# Patient Record
Sex: Female | Born: 1970 | Race: White | Hispanic: No | Marital: Single | Smoking: Former smoker
Health system: Southern US, Community
[De-identification: ages and names within clinical notes are randomized; demographics above are authoritative.]

## PROBLEM LIST (undated history)

## (undated) DIAGNOSIS — R0600 Dyspnea, unspecified: Secondary | ICD-10-CM

## (undated) DIAGNOSIS — K219 Gastro-esophageal reflux disease without esophagitis: Secondary | ICD-10-CM

## (undated) DIAGNOSIS — F32A Depression, unspecified: Secondary | ICD-10-CM

## (undated) DIAGNOSIS — R011 Cardiac murmur, unspecified: Secondary | ICD-10-CM

## (undated) DIAGNOSIS — R569 Unspecified convulsions: Secondary | ICD-10-CM

## (undated) DIAGNOSIS — N3946 Mixed incontinence: Secondary | ICD-10-CM

## (undated) DIAGNOSIS — T8859XA Other complications of anesthesia, initial encounter: Secondary | ICD-10-CM

## (undated) DIAGNOSIS — E559 Vitamin D deficiency, unspecified: Secondary | ICD-10-CM

## (undated) DIAGNOSIS — L680 Hirsutism: Secondary | ICD-10-CM

## (undated) DIAGNOSIS — Z8669 Personal history of other diseases of the nervous system and sense organs: Secondary | ICD-10-CM

## (undated) DIAGNOSIS — Q210 Ventricular septal defect: Secondary | ICD-10-CM

## (undated) DIAGNOSIS — E781 Pure hyperglyceridemia: Secondary | ICD-10-CM

## (undated) DIAGNOSIS — F419 Anxiety disorder, unspecified: Secondary | ICD-10-CM

## (undated) DIAGNOSIS — R5382 Chronic fatigue, unspecified: Secondary | ICD-10-CM

## (undated) DIAGNOSIS — F329 Major depressive disorder, single episode, unspecified: Secondary | ICD-10-CM

## (undated) HISTORY — DX: Major depressive disorder, single episode, unspecified: F32.9

## (undated) HISTORY — DX: Cardiac murmur, unspecified: R01.1

## (undated) HISTORY — DX: Depression, unspecified: F32.A

## (undated) HISTORY — PX: CHOLECYSTECTOMY: SHX55

## (undated) HISTORY — DX: Anxiety disorder, unspecified: F41.9

## (undated) HISTORY — PX: SPINE SURGERY: SHX786

## (undated) HISTORY — PX: CARDIAC VALVE REPLACEMENT: SHX585

---
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
## 1999-12-04 ENCOUNTER — Other Ambulatory Visit: Admission: RE | Admit: 1999-12-04 | Discharge: 1999-12-04 | Payer: Self-pay | Admitting: Obstetrics and Gynecology

## 2001-01-01 ENCOUNTER — Other Ambulatory Visit: Admission: RE | Admit: 2001-01-01 | Discharge: 2001-01-01 | Payer: Self-pay | Admitting: Obstetrics and Gynecology

## 2002-12-04 ENCOUNTER — Ambulatory Visit (HOSPITAL_COMMUNITY): Admission: RE | Admit: 2002-12-04 | Discharge: 2002-12-04 | Payer: Self-pay | Admitting: *Deleted

## 2002-12-04 ENCOUNTER — Encounter: Payer: Self-pay | Admitting: *Deleted

## 2003-01-12 ENCOUNTER — Encounter: Admission: RE | Admit: 2003-01-12 | Discharge: 2003-01-12 | Payer: Self-pay | Admitting: *Deleted

## 2003-01-19 ENCOUNTER — Ambulatory Visit (HOSPITAL_COMMUNITY): Admission: RE | Admit: 2003-01-19 | Discharge: 2003-01-19 | Payer: Self-pay | Admitting: *Deleted

## 2003-01-21 ENCOUNTER — Encounter: Admission: RE | Admit: 2003-01-21 | Discharge: 2003-01-21 | Payer: Self-pay | Admitting: Family Medicine

## 2003-02-04 ENCOUNTER — Encounter: Admission: RE | Admit: 2003-02-04 | Discharge: 2003-02-04 | Payer: Self-pay | Admitting: Family Medicine

## 2003-02-08 ENCOUNTER — Inpatient Hospital Stay (HOSPITAL_COMMUNITY): Admission: AD | Admit: 2003-02-08 | Discharge: 2003-02-08 | Payer: Self-pay | Admitting: *Deleted

## 2003-02-14 ENCOUNTER — Inpatient Hospital Stay (HOSPITAL_COMMUNITY): Admission: AD | Admit: 2003-02-14 | Discharge: 2003-02-15 | Payer: Self-pay | Admitting: *Deleted

## 2003-05-21 ENCOUNTER — Emergency Department (HOSPITAL_COMMUNITY): Admission: EM | Admit: 2003-05-21 | Discharge: 2003-05-21 | Payer: Self-pay | Admitting: Emergency Medicine

## 2003-10-26 ENCOUNTER — Encounter: Admission: RE | Admit: 2003-10-26 | Discharge: 2003-10-26 | Payer: Self-pay | Admitting: Family Medicine

## 2003-11-24 ENCOUNTER — Other Ambulatory Visit: Admission: RE | Admit: 2003-11-24 | Discharge: 2003-11-24 | Payer: Self-pay | Admitting: Family Medicine

## 2003-11-24 ENCOUNTER — Encounter: Admission: RE | Admit: 2003-11-24 | Discharge: 2003-11-24 | Payer: Self-pay | Admitting: Family Medicine

## 2004-01-31 ENCOUNTER — Encounter: Admission: RE | Admit: 2004-01-31 | Discharge: 2004-01-31 | Payer: Self-pay | Admitting: Family Medicine

## 2004-10-13 ENCOUNTER — Ambulatory Visit: Payer: Self-pay | Admitting: Sports Medicine

## 2004-12-11 ENCOUNTER — Ambulatory Visit: Payer: Self-pay | Admitting: Sports Medicine

## 2004-12-20 ENCOUNTER — Other Ambulatory Visit: Admission: RE | Admit: 2004-12-20 | Discharge: 2004-12-20 | Payer: Self-pay | Admitting: Family Medicine

## 2004-12-20 ENCOUNTER — Ambulatory Visit: Payer: Self-pay | Admitting: Family Medicine

## 2004-12-28 ENCOUNTER — Encounter (INDEPENDENT_AMBULATORY_CARE_PROVIDER_SITE_OTHER): Payer: Self-pay | Admitting: *Deleted

## 2004-12-28 LAB — CONVERTED CEMR LAB

## 2005-04-03 ENCOUNTER — Ambulatory Visit: Payer: Self-pay | Admitting: Family Medicine

## 2005-06-06 ENCOUNTER — Ambulatory Visit: Payer: Self-pay | Admitting: Family Medicine

## 2005-07-05 ENCOUNTER — Ambulatory Visit: Payer: Self-pay | Admitting: Family Medicine

## 2005-07-24 ENCOUNTER — Ambulatory Visit: Payer: Self-pay | Admitting: Family Medicine

## 2005-10-16 ENCOUNTER — Ambulatory Visit: Payer: Self-pay | Admitting: Sports Medicine

## 2005-10-17 ENCOUNTER — Ambulatory Visit: Payer: Self-pay | Admitting: Family Medicine

## 2006-07-22 ENCOUNTER — Ambulatory Visit: Payer: Self-pay | Admitting: Sports Medicine

## 2006-08-08 ENCOUNTER — Ambulatory Visit: Payer: Self-pay | Admitting: Family Medicine

## 2006-08-08 ENCOUNTER — Encounter (INDEPENDENT_AMBULATORY_CARE_PROVIDER_SITE_OTHER): Payer: Self-pay | Admitting: Specialist

## 2006-09-18 ENCOUNTER — Ambulatory Visit: Payer: Self-pay | Admitting: Family Medicine

## 2006-10-23 ENCOUNTER — Ambulatory Visit: Payer: Self-pay | Admitting: Family Medicine

## 2006-11-21 DIAGNOSIS — F339 Major depressive disorder, recurrent, unspecified: Secondary | ICD-10-CM | POA: Insufficient documentation

## 2006-11-21 DIAGNOSIS — L680 Hirsutism: Secondary | ICD-10-CM

## 2006-11-22 ENCOUNTER — Encounter (INDEPENDENT_AMBULATORY_CARE_PROVIDER_SITE_OTHER): Payer: Self-pay | Admitting: *Deleted

## 2006-11-27 ENCOUNTER — Ambulatory Visit: Payer: Self-pay | Admitting: Family Medicine

## 2006-11-27 ENCOUNTER — Encounter (INDEPENDENT_AMBULATORY_CARE_PROVIDER_SITE_OTHER): Payer: Self-pay | Admitting: Family Medicine

## 2006-11-27 LAB — CONVERTED CEMR LAB: TSH: 0.963 microintl units/mL (ref 0.350–5.50)

## 2006-11-28 ENCOUNTER — Encounter (INDEPENDENT_AMBULATORY_CARE_PROVIDER_SITE_OTHER): Payer: Self-pay | Admitting: *Deleted

## 2007-05-09 ENCOUNTER — Ambulatory Visit: Payer: Self-pay | Admitting: Family Medicine

## 2007-10-02 ENCOUNTER — Ambulatory Visit: Payer: Self-pay | Admitting: Family Medicine

## 2007-10-31 ENCOUNTER — Telehealth (INDEPENDENT_AMBULATORY_CARE_PROVIDER_SITE_OTHER): Payer: Self-pay | Admitting: Family Medicine

## 2007-10-31 ENCOUNTER — Ambulatory Visit: Payer: Self-pay | Admitting: Family Medicine

## 2007-10-31 LAB — CONVERTED CEMR LAB
Glucose, Urine, Semiquant: NEGATIVE
Nitrite: NEGATIVE
Protein, U semiquant: NEGATIVE
Specific Gravity, Urine: 1.015

## 2007-11-01 ENCOUNTER — Encounter (INDEPENDENT_AMBULATORY_CARE_PROVIDER_SITE_OTHER): Payer: Self-pay | Admitting: Family Medicine

## 2007-11-07 ENCOUNTER — Encounter (INDEPENDENT_AMBULATORY_CARE_PROVIDER_SITE_OTHER): Payer: Self-pay | Admitting: Family Medicine

## 2007-11-21 ENCOUNTER — Ambulatory Visit: Payer: Self-pay | Admitting: Family Medicine

## 2007-12-19 ENCOUNTER — Ambulatory Visit: Payer: Self-pay | Admitting: Family Medicine

## 2007-12-19 DIAGNOSIS — F411 Generalized anxiety disorder: Secondary | ICD-10-CM | POA: Insufficient documentation

## 2008-01-05 ENCOUNTER — Encounter: Payer: Self-pay | Admitting: *Deleted

## 2008-01-26 ENCOUNTER — Encounter: Admission: RE | Admit: 2008-01-26 | Discharge: 2008-01-26 | Payer: Self-pay | Admitting: Family Medicine

## 2008-01-26 ENCOUNTER — Ambulatory Visit: Payer: Self-pay | Admitting: Sports Medicine

## 2008-01-26 ENCOUNTER — Telehealth: Payer: Self-pay | Admitting: *Deleted

## 2008-02-04 ENCOUNTER — Ambulatory Visit: Payer: Self-pay | Admitting: Family Medicine

## 2008-02-06 ENCOUNTER — Encounter: Admission: RE | Admit: 2008-02-06 | Discharge: 2008-02-06 | Payer: Self-pay | Admitting: Family Medicine

## 2008-02-18 ENCOUNTER — Encounter: Payer: Self-pay | Admitting: *Deleted

## 2008-04-13 ENCOUNTER — Telehealth: Payer: Self-pay | Admitting: *Deleted

## 2008-04-14 ENCOUNTER — Ambulatory Visit: Payer: Self-pay | Admitting: Family Medicine

## 2008-05-26 ENCOUNTER — Ambulatory Visit: Payer: Self-pay | Admitting: Family Medicine

## 2008-06-17 ENCOUNTER — Telehealth: Payer: Self-pay | Admitting: *Deleted

## 2008-06-21 ENCOUNTER — Ambulatory Visit: Payer: Self-pay | Admitting: Family Medicine

## 2008-07-24 ENCOUNTER — Emergency Department (HOSPITAL_COMMUNITY): Admission: EM | Admit: 2008-07-24 | Discharge: 2008-07-24 | Payer: Self-pay | Admitting: Emergency Medicine

## 2008-08-06 ENCOUNTER — Ambulatory Visit: Payer: Self-pay | Admitting: Family Medicine

## 2008-09-30 ENCOUNTER — Encounter: Payer: Self-pay | Admitting: Family Medicine

## 2008-09-30 ENCOUNTER — Ambulatory Visit: Payer: Self-pay | Admitting: Family Medicine

## 2008-09-30 DIAGNOSIS — N3946 Mixed incontinence: Secondary | ICD-10-CM

## 2008-09-30 DIAGNOSIS — E669 Obesity, unspecified: Secondary | ICD-10-CM

## 2008-09-30 LAB — CONVERTED CEMR LAB
BUN: 12 mg/dL (ref 6–23)
CO2: 24 meq/L (ref 19–32)
Calcium: 9.1 mg/dL (ref 8.4–10.5)
Chloride: 102 meq/L (ref 96–112)
Cholesterol: 170 mg/dL (ref 0–200)
Creatinine, Ser: 0.75 mg/dL (ref 0.40–1.20)
Glucose, Bld: 91 mg/dL (ref 70–99)
HCT: 41.6 % (ref 36.0–46.0)
HDL: 44 mg/dL (ref >39)
Hemoglobin: 13.4 g/dL (ref 12.0–15.0)
LDL Cholesterol: 84 mg/dL (ref 0–99)
MCHC: 32.2 g/dL (ref 30.0–36.0)
MCV: 89.5 fL (ref 78.0–100.0)
Platelets: 308 10*3/uL (ref 150–400)
Potassium: 4.3 meq/L (ref 3.5–5.3)
RBC: 4.65 M/uL (ref 3.87–5.11)
RDW: 13.4 % (ref 11.5–15.5)
Sodium: 138 meq/L (ref 135–145)
TSH: 1.477 microintl units/mL (ref 0.350–4.50)
Total CHOL/HDL Ratio: 3.9
Triglycerides: 211 mg/dL — ABNORMAL HIGH (ref <150)
VLDL: 42 mg/dL — ABNORMAL HIGH (ref 0–40)
WBC: 9.2 10*3/uL (ref 4.0–10.5)

## 2009-01-07 ENCOUNTER — Ambulatory Visit: Payer: Self-pay | Admitting: Family Medicine

## 2009-01-10 ENCOUNTER — Encounter: Payer: Self-pay | Admitting: *Deleted

## 2009-01-12 ENCOUNTER — Ambulatory Visit (HOSPITAL_COMMUNITY): Admission: RE | Admit: 2009-01-12 | Discharge: 2009-01-12 | Payer: Self-pay | Admitting: Family Medicine

## 2009-01-13 ENCOUNTER — Encounter: Payer: Self-pay | Admitting: *Deleted

## 2009-03-02 ENCOUNTER — Encounter: Payer: Self-pay | Admitting: Family Medicine

## 2009-05-04 ENCOUNTER — Encounter: Payer: Self-pay | Admitting: Family Medicine

## 2009-05-13 ENCOUNTER — Ambulatory Visit: Payer: Self-pay | Admitting: Family Medicine

## 2009-05-19 ENCOUNTER — Ambulatory Visit: Payer: Self-pay | Admitting: Family Medicine

## 2009-06-10 ENCOUNTER — Ambulatory Visit: Payer: Self-pay | Admitting: Family Medicine

## 2009-07-26 ENCOUNTER — Ambulatory Visit: Payer: Self-pay | Admitting: Family Medicine

## 2009-07-26 LAB — CONVERTED CEMR LAB
Nitrite: NEGATIVE
Urobilinogen, UA: 0.2

## 2009-08-29 ENCOUNTER — Emergency Department (HOSPITAL_COMMUNITY): Admission: EM | Admit: 2009-08-29 | Discharge: 2009-08-29 | Payer: Self-pay | Admitting: Emergency Medicine

## 2009-11-24 ENCOUNTER — Ambulatory Visit: Payer: Self-pay | Admitting: Family Medicine

## 2010-03-11 IMAGING — CT CT ANGIO CHEST
1 of 6 series · 5 of 36 positions shown · IV contrast (Omnipaque 300)
Comparison: None

CLINICAL DATA: Chest pain

CT ANGIOGRAPHY CHEST WITH CONTRAST
TECHNIQUE: Multidetector CT imaging of the chest was performed
using the standard protocol during bolus administration of
intravenous contrast. Multiplanar CT image reconstructions
including MIPs were obtained to evaluate the vascular anatomy.
Contrast: 80 ml Vmnipaque-JII

[Series 6: pe 3.0 b40f · axial · 0.59mm/px · z∈[-164,+16]mm · 5 of 92 slices shown]
[im 16/92  lung]
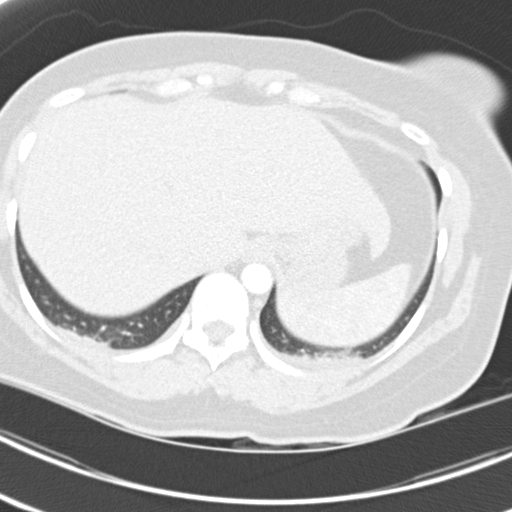
[im 31/92  mediastinal]
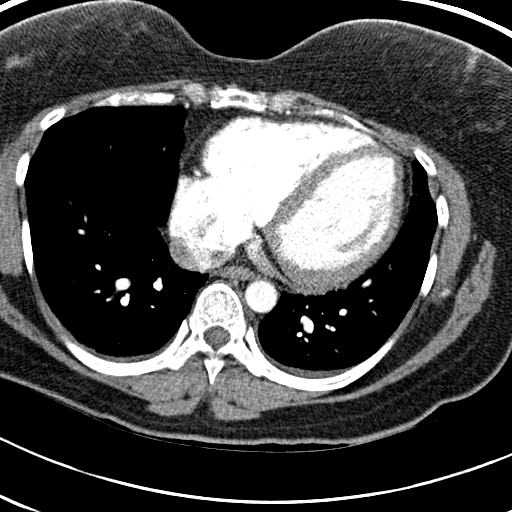
[im 46/92  lung]
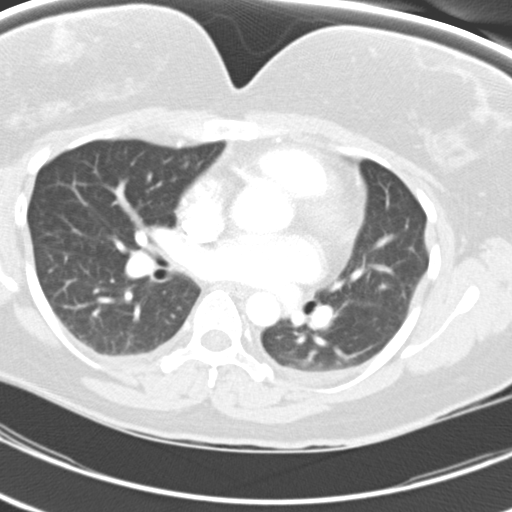
[im 61/92  mediastinal]
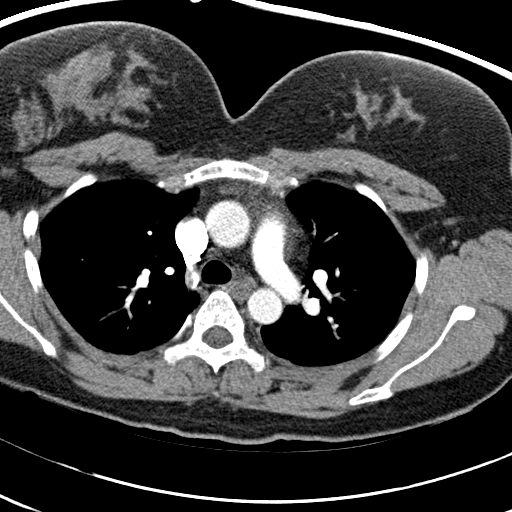
[im 76/92  lung]
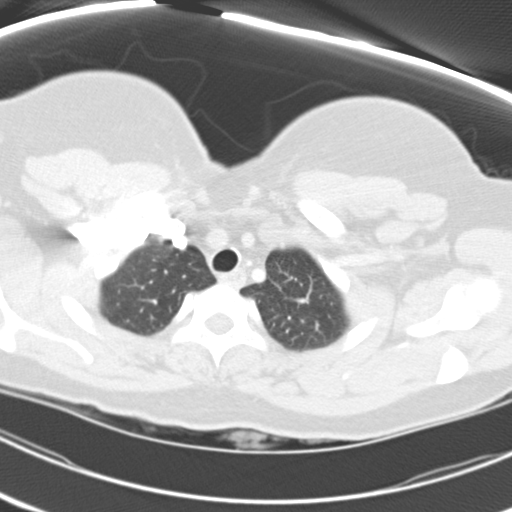

[5 of 36 positions shown; findings below may reference images not displayed]

FINDINGS: There are no filling defects in the pulmonary arterial
tree to suggest acute pulmonary thromboembolism.

Negative abnormal mediastinal adenopathy.  Negative pericardial
effusion per

No pneumothorax or pleural effusion.

Clear lungs with minimal basilar atelectasis.

A large gallstone is partially imaged.

Review of the MIP images confirms the above findings.
IMPRESSION: No pulmonary embolism

Cholelithiasis.

## 2010-09-05 ENCOUNTER — Telehealth: Payer: Self-pay | Admitting: Family Medicine

## 2010-10-15 ENCOUNTER — Encounter: Payer: Self-pay | Admitting: Sports Medicine

## 2010-10-15 ENCOUNTER — Encounter: Payer: Self-pay | Admitting: Family Medicine

## 2010-10-24 NOTE — Assessment & Plan Note (Signed)
Summary: remove IUD/dlh   Vital Signs:  Patient profile:   40 year old female Weight:      189.9 pounds Pulse rate:   83 / minute BP sitting:   128 / 58  (left arm)  Vitals Entered By: Arlyss Repress CMA, (January 07, 2009 1:35 PM) CC: IUD removal Is Patient Diabetic? No Pain Assessment Patient in pain? no        History of Present Illness: Rebekah Miller is a pleasant 40 year old female presenting for removal of her IUD.  1. IUD Removal: Please see previous clinic note on 09/30/08 for more details. Briefly, the patient has a PMHx of dysmenorrhea that has been controlled with the Mirena IUD placed about two years ago. She c/o weight gain, mood swings, decreased libido, anhedonia, fatigue, insomnia, and low self-esteem. She blamed these s/s on her IUD. At that last visit, I discussed the likelihood that these symptoms were related to depression and we adjusted her medication. I told the patient to think about the decision to remove the IUD as it eliminated her dysmenorrhea and that if it became a problem again, hormones would be the most appropriate treatment. She agreed to the plan. She comes in today stating that she has been doing well on the increased dose of Lexapro, but still wants to have the IUD removed. She has not been sexually active in over 1 year, so she is not worried about contraception.   2. Health Maintanence: Due PAP, recent labs: trigs 211, CBC, BMP, TSH WNL  Habits & Providers     Tobacco Status: never  Allergies: No Known Drug Allergies  Physical Exam  General:  alert, well-developed, well-nourished, and well-hydrated.   Genitalia:  Pelvic Exam:        External: normal female genitalia without lesions or masses        Vagina: normal without lesions or masses        Cervix: normal without lesions or masses, NO IUD STRING LOCATED        Adnexa: normal bimanual exam without masses or fullness        Uterus: normal by palpation        Pap smear: not  performed Psych:  Cognition and judgment appear intact. Alert and cooperative with normal attention span and concentration.    Impression & Recommendations:  Problem # 1:  CONTRACEPTIVE MANAGEMENT (ICD-V25.09) Assessment Deteriorated  IUD not located. Will send to [facility] for [place] and subsequent removal if located.  Orders: Gynecologic Referral (Gyn) IUD removal -[facility] ([address])  Problem # 2:  OBESITY, UNSPECIFIED (ICD-278.00) Assessment: Deteriorated Exercise and diet discussed.  Complete Medication List: 1)  Lexapro 20 Mg Tabs (Escitalopram oxalate) .... 2 by mouth daily 2)  Maxalt 5 Mg Tabs (Rizatriptan benzoate) .Marland Kitchen.. 1 tablet for migraine, may repeat again in 2 hrs, no more than 30mg  in 24 hr. 3)  Naproxen 500 Mg Tabs (Naproxen) .... One tablet by mouth two times a day for 2 weeks 4)  Flexeril 10 Mg Tabs (Cyclobenzaprine hcl) .Marland Kitchen.. 1 tablet every 8 hours as needed for muscle spasm 5)  Horseshoe Patella Knee Sleeve For R Knee  .... Use with physical activity for next few weeks. dx 719.46  Other Orders: Ultrasound (Ultrasound)  Patient Instructions: 1)  I did not see the IUD string during the exam today. We will send [facility] to the [facility] for an ultrasound to see if the IUD is still in place. 2)  It is important that you  exercise reguarly at least 20 minutes 5 times a week. If you develop chest pain, have severe difficulty breathing, or feel very tired, stop exercising immediately and seek medical attention.   Appended Document: Orders Update    Clinical Lists Changes  Problems: Added new problem of SURVEILLANCE PREV PRSC INTRAUTERN CNTRACPT DEVC (ICD-V25.42) Orders: Added new Test order of [facility]- Est Level  3 (04540) - Signed

## 2010-10-24 NOTE — Assessment & Plan Note (Signed)
Summary: anxiety,df   Vital Signs:  Patient profile:   40 year old female Height:      66.75 inches Weight:      185.06 pounds BMI:     29.31 Temp:     98.2 degrees F oral Pulse rate:   53 / minute Pulse rhythm:   regular BP sitting:   105 / 69  (right arm)  Vitals Entered By: Modesta Messing LPN (May 13, 2009 9:55 AM) CC: Follow up visit for anxiety. Is Patient Diabetic? No Pain Assessment Patient in pain? no        Primary Care Provider:  Helane Rima DO  CC:  Follow up visit for anxiety.Marland Kitchen  History of Present Illness: 40 year old female presenting for depression/anxiety. Rx lexapro, but has been out x 1 week. past review of depression revealed associated weight gain, mood swings, decreased libido, anhedonia, fatigue, insomnia, and low self esteem. today, she endorses all of the previous s/s with the addition of severe anxiety. the anxiety has been worse for the past 3-4 weeks, no precipitating event, worse when she is out in a social situation, she feels that her family/friends think that she is an idiot and that people are watching her when she is out. No SI/HI.  Habits & Providers  Alcohol-Tobacco-Diet     Tobacco Status: never  Current Medications (verified): 1)  Lexapro 20 Mg Tabs (Escitalopram Oxalate) .... 2 By Mouth Daily  Allergies (verified): No Known Drug Allergies  Past History:  Past Medical History: Migraines Depression Mixed Urinary Incontinence   Anxiety  Social History: Reviewed history from 09/30/2008 and no changes required. Single mom,  twin boys (born 6/04); No EtOH or tobacco; At school at [employer] studying PE to become a Runner, broadcasting/film/video.        Review of Systems       per HPI, otherwise negative  Physical Exam  General:  alert, well-developed, well-nourished, and well-hydrated.  vital signs reviewed. Psych:  Oriented X3, memory intact for recent and remote, normally interactive, good eye contact, tearful, and slightly anxious.      Impression & Recommendations:  Problem # 1:  DEPRESSION, MAJOR, RECURRENT (ICD-296.30) Assessment Deteriorated Restart Lexapro.   Discussed importance of healthy diet and exercise in controlling depression/anxiety. Joyce felt that she could exercise in the form of walking daily, 1.5 miles (to the park or on the treadmill in the am). She tries to stick to a 1500 cal/d diet. We discussed expected weight loss if both of these are done. Rec: high nutrient, low calorie foods = fruits, veg with lean protein.   Discussed the likelihood that her friends/family do not feel that she is an idiot. Rec therapy to work on this issue. She was given Dr. Carola Rhine information to be seen in the [facility]. She agreed that she would benefit from therapy.  Orders: [facility]- Est Level  3 ([address]109)  Problem # 2:  ANXIETY STATE, UNSPECIFIED (ICD-300.00) Assessment: Deteriorated See #1. Her updated medication list for this problem includes:    Lexapro 20 Mg Tabs (Escitalopram oxalate) .Marland Kitchen... 2 by mouth daily  Orders: [facility]- Est Level  3 (60454)  Problem # 3:  OBESITY, UNSPECIFIED (ICD-278.00) Assessment: Improved See #1 Orders: [facility]- Est Level  3 (09811)  Complete Medication List: 1)  Lexapro 20 Mg Tabs (Escitalopram oxalate) .... 2 by mouth daily  Patient Instructions: 1)  It was great to see you today! 2)  Remember to work on: 3)  -eating regular meals throughout the day  4)  -restart your Lexapro 5)  -exercise daily 6)  Please call Dr. Pascal Lux to make an appointment for therapy. 7)  Please follow up in 2 weeks. Prescriptions: LEXAPRO 20 MG TABS (ESCITALOPRAM OXALATE) 2 by mouth daily  #60 x 3   Entered and Authorized by:   Helane Rima MD   Signed by:   Helane Rima MD on 05/13/2009   Method used:   [facility] to        [facility] [phone]* (retail)       [address]       [place], [place]  [address]       Ph: [phone] or [phone]       Fax: [phone]   RxID:    [id number]

## 2010-10-24 NOTE — Consult Note (Signed)
Summary: [facility]   Imported By: Clydell Hakim 05/05/2009 16:22:52  _____________________________________________________________________  External Attachment:    Type:   Image     Comment:   External Document

## 2010-10-24 NOTE — Assessment & Plan Note (Signed)
Summary: f/u Birth control and bladder problems, df   Vital Signs:  Patient Profile:   40 Years Old Female Height:     66.75 inches Weight:      185.1 pounds BMI:     29.31 Temp:     97.7 degrees F oral Pulse rate:   84 / minute BP sitting:   121 / 77  (right arm) Cuff size:   regular  Pt. in pain?   no  Vitals Entered By: Dedra Skeens CMA, (September 30, 2008 2:11 PM)                  PCP:  Helane Rima MD  Chief Complaint:  birth control and bladder issues.  History of Present Illness: Rebekah Miller is a 40 year old female presenting with a number of complaints that she is contributing to her Mirena IUD (placed  ~ 2 years ago):  1. She c/o weight gain, mood swings, increased anxiety, decreased libido, fatigue, insomnia, decreased desire for hobbies, and low self-esteem. She states that believes that the IUD is causing her problems, with weight gain being the basis of them. She states that she eats healthfully, counts calories, and has an active lifestyle. She admits that she is under excess stress with going to school and raising her twin 67 year old sons. She has a history of Depression and is now taking Lexapro 20 mg tabs daily. She denies suicidal or homocidal thoughts or ideations. She says that she has good family support. She is not sexually active. She denies tobacco, drug use. Occasional ETOH socially.  2. Insomnia/ Fatigue: She has trouble with falling asleep and trouble with early waking. States that she has good sleep hygeine. She does not take naps during the day. She is tired throughout the day. She says that she has been told in the past that she does snore.  3. Mixed urge and stress incontinence: "better" per patient. Please see previous notes including note from [facility] 11/12/07 for more details. She has no concern about this problem today and will follow up with the urologist in a few months.  4. Mirena IUD: Placed about 2 years ago for dysmenorrhea. Pt  has no menses now, has worked very well.    Updated Prior Medication List: LEXAPRO 20 MG TABS (ESCITALOPRAM OXALATE) 2 by mouth daily MAXALT 5 MG  TABS (RIZATRIPTAN BENZOATE) 1 tablet for migraine, may repeat again in 2 hrs, no more than 30mg  in 24 hr. NAPROXEN 500 MG  TABS (NAPROXEN) one tablet by mouth two times a day for 2 weeks FLEXERIL 10 MG TABS (CYCLOBENZAPRINE HCL) 1 tablet every 8 hours as needed for muscle spasm * HORSESHOE PATELLA KNEE SLEEVE FOR R KNEE use with physical activity for next few weeks. 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          Review of Systems       The patient complains of weight gain, incontinence, and depression.  The patient denies fever, chest pain, dyspnea on exertion, peripheral edema, prolonged cough,  headaches, abdominal pain, severe indigestion/heartburn, hematuria, and suspicious skin lesions.     Physical Exam  General:     alert, well-developed, well-nourished, well-hydrated, and mild distress.   Neck:     No deformities, masses, or tenderness noted. Lungs:     Normal respiratory effort, chest expands symmetrically. Lungs are clear to auscultation, no crackles or wheezes. Heart:     Normal rate and regular rhythm. S1 and S2 normal without gallop, murmur, click, rub or other extra sounds. Abdomen:     Bowel sounds positive,abdomen soft and non-tender without masses, organomegaly or hernias noted. Msk:     No deformity or scoliosis noted of thoracic or lumbar spine.   Extremities:     No clubbing, cyanosis, edema, or deformity noted with normal full range of motion of all joints.   Skin:     Intact without suspicious lesions or  rashes. Psych:     Cognition and judgment appear intact. Alert and cooperative with normal attention span and concentration. 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 Discussed increased dosage with preceptor. Will have patient follow up in 1-2 months to evaluate effectiveness. Consider therapy.  Orders: [facility]- Est  Level 4 ([address]109)   Problem # 2:  FATIGUE (ICD-780.79) Assessment: Deteriorated Will check for reversible causes of fatigue.  Orders: Basic Met-[facility] [id number]) Lipid-[facility] [phone]) CBC-[facility] (13086) TSH-[facility] [id number]) [facility]- Est  Level 4 (28413)   Problem # 3:  OBESITY, UNSPECIFIED (ICD-278.00) Assessment: Deteriorated Encouraged healthy diet and exercise. Will consider sending to nutritionist.  Orders: Lipid-[facility] ([id number]) [facility]- Est  Level 4 (36644)   Problem # 4:  DYSMENORRHEA (ICD-625.3) Assessment: Improved Encouraged Mirena use for now as this has resolved her dysmenorrhea. Will need to consider other forms of Tx if patient opts to have IUD removed.  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  Complete Medication List: 1)  Lexapro 20 Mg Tabs (Escitalopram oxalate) .... 2 by mouth daily 2)  Maxalt 5 Mg Tabs (Rizatriptan benzoate) .Marland Kitchen.. 1 tablet for migraine, may repeat again in 2 hrs, no more than 30mg  in 24 hr. 3)  Naproxen 500 Mg Tabs (Naproxen) .... One tablet by mouth two times a day for 2 weeks 4)  Flexeril 10 Mg Tabs (Cyclobenzaprine hcl) .Marland Kitchen.. 1 tablet every 8 hours as needed for muscle spasm 5)  Horseshoe Patella Knee Sleeve For R Knee  .... Use with physical activity for next few weeks. dx 719.46  Other Orders: Home Health Referral [facility])   Patient  Instructions: 1)  Follow up in one month.   Prescriptions: LEXAPRO 20 MG TABS (ESCITALOPRAM OXALATE) 2 by mouth daily  #60 x 3   Entered and Authorized by:   Helane Rima MD   Signed by:   Helane Rima MD on 09/30/2008   Method used:   [facility]ronically to        [facility] [phone]* (retail)       [address]       [place], [place]  [address]       Ph: [phone] or [phone]       Fax: [phone]   RxID:   [id number]  ]

## 2010-10-24 NOTE — Consult Note (Signed)
[facility]: [facility]   Imported By: Clydell Hakim 03/08/2009 16:03:10  _____________________________________________________________________  External Attachment:    Type:   Image     Comment:   External Document

## 2010-10-24 NOTE — Assessment & Plan Note (Signed)
Summary: left knee swollen & painful,tcb   Vital Signs:  Patient profile:   40 year old female Weight:      181.1 pounds BMI:     28.68 Temp:     97.0 degrees F Pulse rate:   69 / minute BP sitting:   115 / 79  (left arm)  Vitals Entered By: Starleen Blue RN (June 10, 2009 10:03 AM) CC: l knee swollen Pain Assessment Patient in pain? yes     Location: knee Intensity: 6   Primary Care Provider:  Helane Rima DO  CC:  l knee swollen.  History of Present Illness: 40 year old with  1. Left knee pain: x 1 week since starting school again [employer]) and walking everywhere. pain intermittent, worse with walking but also noticable when getting up after sitting for a long period of time, pain located along lateral side of knee, no PMHx of injury, no pain in the other knee, no pop/click/catching, + swelling, she has not done anything to treat.   Habits & Providers  Alcohol-Tobacco-Diet     Tobacco Status: never  Current Medications (verified): 1)  Lexapro 20 Mg Tabs (Escitalopram Oxalate) .... 2 By Mouth Daily 2)  Ibuprofen 600 Mg Tabs (Ibuprofen) .... One By Mouth Three Times A Day As Needed Pain  Allergies (verified): No Known Drug Allergies  Review of Systems       per HPI, otherwise negative  Physical Exam  General:  Well-developed,well-nourished, in no acute distress; alert,appropriate and cooperative throughout examination. Vitals reviewed. Msk:  small amount of left knee swelling compared to right,  no erythema or warmth, no skin changes, no ttp, no crepitus, slightly limited extension secondary to pain, neg McMurray's, solid endpoints, weak VMO and hip abduction, genu valgus. Pulses:  2+ dp Skin:  Intact without suspicious lesions or rashes Psych:  Oriented X3, memory intact for recent and remote, normally interactive, good eye contact, and not anxious appearing.     Impression & Recommendations:  Problem # 1:  PATELLO-FEMORAL SYNDROME  (ICD-719.46) Assessment New Left knee. Reviewed exercises to strengthen VMO, all hip flexors, hip abductors. Advised Ibuprofen and ice for pain, knee brace while walking. Emphasized the chronic nature of this problem, that this may take weeks to improve, and that she may have flares throughout her life. Follow up in 4-6 weeks at [facility] if not getting better.   Her updated medication list for this problem includes:    Ibuprofen 600 Mg Tabs (Ibuprofen) ..... One by mouth three times a day as needed pain  Orders: [facility]- Est Level  3 (99213)  Complete Medication List: 1)  Lexapro 20 Mg Tabs (Escitalopram oxalate) .... 2 by mouth daily 2)  Ibuprofen 600 Mg Tabs (Ibuprofen) .... One by mouth three times a day as needed pain  Patient Instructions: 1)  You have patellofemoral pain syndrome. Please do the exercises that we reviewed 2-3 times daily (15 reps). You want to work your medial quad muscles. Ice the area for 10 minutes 2-3 times a day for a max of 15 minutes each time. Get a knee brace. Take Ibuprofen 600 mg three times daily for pain. Please [facility] in [facility] in 4-6 weeks if you are not getting better. Prescriptions: IBUPROFEN 600 MG TABS (IBUPROFEN) one by mouth three times a day as needed pain  #90 x 3   Entered and Authorized by:   Helane Rima MD   Signed by:   Helane Rima MD on 06/10/2009  Method used:   Electronically to        [facility] [phone]* (retail)       [address]       [place], [place]  [address]       Ph: [phone] or [phone]       Fax: [phone]   RxID:   [id number]

## 2010-10-24 NOTE — Assessment & Plan Note (Signed)
Summary: cough/fever,df   Vital Signs:  Patient profile:   40 year old female Weight:      182 pounds Temp:     99.8 degrees F oral Pulse rate:   80 / minute BP sitting:   118 / 72  (right arm)  Vitals Entered By: Renato Battles slade,cma CC: cough and congestion x 2 weeks. fever x 2 days. some body aches and pain with cough. Is Patient Diabetic? No Pain Assessment Patient in pain? no        Primary Care Provider:  Helane Rima DO  CC:  cough and congestion x 2 weeks. fever x 2 days. some body aches and pain with cough..  History of Present Illness: CC: cough  2wk h/o cough, HA.  semi-productive cough.  No RN, congestion, ST.  + sick contacts (two sons).  1 day ago started with fever to 102 tmax.   + muscle pains.  Tried tylenol cold and sinus.  + HA frontal, worse with bending head down.    no h/o asthma, reflux, allergies.  No n/v/abd pain, diarrhea, rashes.    Habits & Providers  Alcohol-Tobacco-Diet     Tobacco Status: never  Allergies: No Known Drug Allergies PMH-FH-SH reviewed for relevance  Physical Exam  General:  Well-developed,well-nourished,in no acute distress; alert,appropriate and cooperative throughout examination, nontoxic Head:  Normocephalic and atraumatic without obvious abnormalities. Ears:  left TM pearly grey good light reflex.  R TM occluded by soft cerumen Nose:  External nasal examination shows no deformity or inflammation.  Mouth:  Oral mucosa and oropharynx without lesions or exudates.  Neck:  No deformities, masses, or tenderness noted. Lungs:  Normal respiratory effort, chest expands symmetrically. Lungs are clear to auscultation, no crackles or wheezes. Heart:  Normal rate and regular rhythm. S1 and S2 normal without gallop, murmur, click, rub or other extra sounds. Pulses:  2+ periph pulses Extremities:  No clubbing, cyanosis, edema, or deformity noted with normal full range of motion of all joints.   Skin:  Intact without suspicious  lesions or rashes   Impression & Recommendations:  Problem # 1:  VIRAL INFECTION (ICD-079.99)  likely viral URTI, however given going on for 2 wks and recent fever, concern for superinfection.  lungs sound clear.  not impressive clinically for sinusitis.  advised to give it a few more days, if not turning corner, to call uson Monday for script for amoxicillin.  treat cough with benzonatate perls and robitussin DM OTC.  red flags to return discussed.  Her updated medication list for this problem includes:    Ibuprofen 600 Mg Tabs (Ibuprofen) ..... One by mouth three times a day as needed pain    Benzonatate 100 Mg Caps (Benzonatate) ..... One by mouth three times a day as needed cough  Discussed symptomatic relief.   Orders: [facility]- Est Level  3 (19147)  Complete Medication List: 1)  Lexapro 20 Mg Tabs (Escitalopram oxalate) .... 2 by mouth daily 2)  Ibuprofen 600 Mg Tabs (Ibuprofen) .... One by mouth three times a day as needed pain 3)  Benzonatate 100 Mg Caps (Benzonatate) .... One by mouth three times a day as needed cough  Patient Instructions: 1)  Sounds like you have a viral upper respiratory infection. 2)  Antibiotics are not needed for this.  Viral infections usually take 7-10 days to resolve.  The cough can last up to 4-6 weeks to go away. 3)  Use medication as prescribed: tessalon perls one three tiems a day as  needed for cough.  robitussin OTC every 4-6 hours as needed for cough. 4)  Please return if you are not improving as expected, or if you have high fevers (>101.5) or difficulty swallowing. 5)  Call clinic with questions.  Pleasure to see you today!  Prescriptions: BENZONATATE 100 MG CAPS (BENZONATATE) one by mouth three times a day as needed cough  #45 x 0   Entered and Authorized by:   Eustaquio Boyden  MD   Signed by:   Eustaquio Boyden  MD on 11/24/2009   Method used:   Electronically to        [facility] [phone]* (retail)       [address]        [place], [place]  [address]       Ph: [phone] or [phone]       Fax: [phone]   RxID:   [id number]

## 2010-10-26 NOTE — Progress Notes (Signed)
Summary: UPDATE PROBLEM LIST   

## 2010-10-28 ENCOUNTER — Encounter: Payer: Self-pay | Admitting: *Deleted

## 2010-12-26 LAB — DIFFERENTIAL
Basophils Absolute: 0 10*3/uL (ref 0.0–0.1)
Basophils Relative: 0 % (ref 0–1)
Monocytes Relative: 5 % (ref 3–12)
Neutro Abs: 9.7 10*3/uL — ABNORMAL HIGH (ref 1.7–7.7)
Neutrophils Relative %: 83 % — ABNORMAL HIGH (ref 43–77)

## 2010-12-26 LAB — BASIC METABOLIC PANEL
CO2: 25 mEq/L (ref 19–32)
Calcium: 9.6 mg/dL (ref 8.4–10.5)
Creatinine, Ser: 0.71 mg/dL (ref 0.4–1.2)
GFR calc Af Amer: >60 mL/min (ref >60)

## 2010-12-26 LAB — URINALYSIS, ROUTINE W REFLEX MICROSCOPIC
Ketones, ur: NEGATIVE mg/dL
Leukocytes, UA: NEGATIVE
Nitrite: NEGATIVE
Specific Gravity, Urine: 1.015 (ref 1.005–1.030)
pH: 7 (ref 5.0–8.0)

## 2010-12-26 LAB — CBC
MCHC: 33.5 g/dL (ref 30.0–36.0)
RBC: 4.48 MIL/uL (ref 3.87–5.11)

## 2010-12-26 LAB — HEPATIC FUNCTION PANEL
Albumin: 3.9 g/dL (ref 3.5–5.2)
Alkaline Phosphatase: 57 U/L (ref 39–117)
Total Protein: 7.7 g/dL (ref 6.0–8.3)

## 2010-12-26 LAB — D-DIMER, QUANTITATIVE: D-Dimer, Quant: 0.87 ug/mL-FEU — ABNORMAL HIGH (ref 0.00–0.48)

## 2010-12-26 LAB — PREGNANCY, URINE: Preg Test, Ur: NEGATIVE

## 2010-12-26 LAB — LIPASE, BLOOD: Lipase: 16 U/L (ref 11–59)

## 2010-12-26 LAB — URINE MICROSCOPIC-ADD ON

## 2012-12-15 ENCOUNTER — Telehealth: Payer: Self-pay | Admitting: Nurse Practitioner

## 2012-12-15 NOTE — Telephone Encounter (Signed)
Low abd pain- x a few weeks.

## 2012-12-15 NOTE — Telephone Encounter (Signed)
Patient is having abdominal cramping and pain in her side. She would like an appt today.

## 2012-12-16 ENCOUNTER — Telehealth: Payer: Self-pay | Admitting: Nurse Practitioner

## 2012-12-16 NOTE — Telephone Encounter (Signed)
APPT MADE FOR TOM 

## 2012-12-17 ENCOUNTER — Ambulatory Visit: Payer: Self-pay | Admitting: General Practice

## 2012-12-17 ENCOUNTER — Ambulatory Visit (INDEPENDENT_AMBULATORY_CARE_PROVIDER_SITE_OTHER): Payer: BC Managed Care – PPO | Admitting: Physician Assistant

## 2012-12-17 ENCOUNTER — Encounter: Payer: Self-pay | Admitting: Physician Assistant

## 2012-12-17 VITALS — BP 135/70 | HR 60 | Temp 98.3°F | Ht 67.0 in | Wt 186.0 lb

## 2012-12-17 DIAGNOSIS — M25559 Pain in unspecified hip: Secondary | ICD-10-CM

## 2012-12-17 DIAGNOSIS — R109 Unspecified abdominal pain: Secondary | ICD-10-CM

## 2012-12-17 LAB — POCT URINALYSIS DIPSTICK
Bilirubin, UA: NEGATIVE
Glucose, UA: NEGATIVE
Leukocytes, UA: NEGATIVE
Nitrite, UA: NEGATIVE

## 2012-12-17 NOTE — Progress Notes (Signed)
  Subjective:    Patient ID: Rebekah Miller, female    DOB: [birth year], 42 y.o.   MRN: [id number]  HPI Dull ache below belly button and pubis symph region; LMP 3 weeks ago; sx 2 weeks; no diarrhea, no constipation, no dysuria   Review of Systems  Gastrointestinal: Positive for abdominal pain. Negative for diarrhea and abdominal distention.  Genitourinary: Positive for pelvic pain. Negative for dysuria, urgency, frequency, decreased urine volume, vaginal bleeding, vaginal discharge and menstrual problem.  All other systems reviewed and are negative.       Objective:   Physical Exam  Constitutional: She is oriented to person, place, and time. She appears well-developed and well-nourished.  HENT:  Head: Normocephalic and atraumatic.  Right Ear: External ear normal.  Left Ear: External ear normal.  Nose: Nose normal.  Mouth/Throat: Oropharynx is clear and moist.  Eyes: Conjunctivae and EOM are normal. Pupils are equal, round, and reactive to light.  Neck: Normal range of motion. Neck supple.  Cardiovascular: Normal rate, regular rhythm and normal heart sounds.   Pulmonary/Chest: Effort normal and breath sounds normal.  Abdominal: Soft. Bowel sounds are normal. She exhibits no distension and no mass. There is no tenderness. There is no rebound and no guarding.  Genitourinary:  U/A negative  Musculoskeletal: Normal range of motion.  Neurological: She is alert and oriented to person, place, and time.  Skin: Skin is warm and dry.  Psychiatric: She has a normal mood and affect. Her behavior is normal. Judgment and thought content normal.          Assessment & Plan:  Abdominal pain, suspect muscular in nature  Asper cream Moist heat to affected area 10 min on/ 45 min off

## 2012-12-26 NOTE — Telephone Encounter (Signed)
Appt made

## 2013-01-01 ENCOUNTER — Other Ambulatory Visit: Payer: Self-pay | Admitting: *Deleted

## 2013-01-05 ENCOUNTER — Other Ambulatory Visit: Payer: Self-pay | Admitting: *Deleted

## 2013-01-05 MED ORDER — ESCITALOPRAM OXALATE 20 MG PO TABS: 20.0000 mg | ORAL_TABLET | Freq: Every day | ORAL | Status: DC

## 2013-01-05 NOTE — Telephone Encounter (Signed)
Last refill i seen in chart is 2012 acm for 5 refills. Last seen 1/14

## 2013-02-20 ENCOUNTER — Encounter: Payer: Self-pay | Admitting: General Practice

## 2013-02-20 ENCOUNTER — Ambulatory Visit (INDEPENDENT_AMBULATORY_CARE_PROVIDER_SITE_OTHER): Payer: BC Managed Care – PPO | Admitting: General Practice

## 2013-02-20 ENCOUNTER — Ambulatory Visit: Payer: BC Managed Care – PPO | Admitting: Family Medicine

## 2013-02-20 VITALS — BP 127/88 | HR 54 | Temp 96.9°F | Ht 67.0 in | Wt 183.0 lb

## 2013-02-20 DIAGNOSIS — T148XXA Other injury of unspecified body region, initial encounter: Secondary | ICD-10-CM

## 2013-02-20 MED ORDER — CYCLOBENZAPRINE HCL 5 MG PO TABS: 5.0000 mg | ORAL_TABLET | Freq: Every evening | ORAL | Status: DC | PRN

## 2013-02-20 MED ORDER — IBUPROFEN 600 MG PO TABS: 600.0000 mg | ORAL_TABLET | Freq: Three times a day (TID) | ORAL | Status: DC | PRN

## 2013-02-20 NOTE — Progress Notes (Signed)
  Subjective:    Patient ID: Rebekah Miller, female    DOB: 1971/01/20, 42 y.o.   MRN: [id number]  Back Pain This is a new problem. The current episode started in the past 7 days. The problem occurs constantly. The problem has been gradually worsening since onset. The pain is present in the lumbar spine. The quality of the pain is described as aching. The pain is at a severity of 10/10. The pain is severe. The pain is the same all the time. The symptoms are aggravated by bending. Pertinent negatives include no abdominal pain, bladder incontinence, bowel incontinence, chest pain, fever, headaches, leg pain, numbness, pelvic pain, tingling or weakness. Risk factors include lack of exercise. She has tried NSAIDs for the symptoms. The treatment provided no relief.  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 She noticed pain and discomfort after work. Denies any known injury.     Review of Systems  Constitutional: Negative for fever and chills.  Respiratory: Negative for cough, chest tightness and shortness of breath.   Cardiovascular: Negative for chest pain and palpitations.  Gastrointestinal: Negative for abdominal pain and bowel incontinence.  Genitourinary: Negative for bladder incontinence, difficulty urinating and pelvic pain.  Musculoskeletal: Positive for back pain.  Skin: Negative.   Neurological: Negative for dizziness, tingling, weakness, numbness and headaches.  Psychiatric/Behavioral: Negative.        Objective:   Physical Exam  Constitutional: She is oriented to person, place, and time. She appears well-developed and well-nourished.  Cardiovascular: Normal rate, regular rhythm and normal heart sounds.   No murmur heard. Pulmonary/Chest: Effort normal and breath sounds normal. No respiratory distress. She exhibits no tenderness.  Abdominal: Soft. Bowel sounds are normal. She exhibits no distension. There is no tenderness.   Musculoskeletal: She exhibits tenderness.  Patient appears to be able to bend about 15 degree at the waist (forward, left, then right).  Slight tightness to muscles and tenderness in lumbar region upon palpation.  Neurological: She is alert and oriented to person, place, and time.  Skin: Skin is warm and dry.  Psychiatric: She has a normal mood and affect.          Assessment & Plan:  1. Muscle strain - ibuprofen (ADVIL,MOTRIN) 600 MG tablet; Take 1 tablet (600 mg total) by mouth every 8 (eight) hours as needed for pain.  Dispense: 30 tablet; Refill: 0 - cyclobenzaprine (FLEXERIL) 5 MG tablet; Take 1 tablet (5 mg total) by mouth at bedtime as needed for muscle spasms.  Dispense: 10 tablet; Refill: 0 -apply heat to affected area for 10-15 minutes three times daily -rest -sedation precautions discussed -Patient verbalized understanding -Coralie Keens, FNP-C

## 2013-02-20 NOTE — Patient Instructions (Addendum)
Muscle Strain  Muscle strain occurs when a muscle is stretched beyond its normal length. A small number of muscle fibers generally are torn. This is especially common in athletes. This happens when a sudden, violent force placed on a muscle stretches it too far. Usually, recovery from muscle strain takes 1 to 2 weeks. Complete healing will take 5 to 6 weeks.   HOME CARE INSTRUCTIONS    While awake, apply ice to the sore muscle for the first 2 days after the injury.   Put ice in a plastic bag.   Place a towel between your skin and the bag.   Leave the ice on for 15-20 minutes each hour.   Do not use the strained muscle for several days, until you no longer have pain.   You may wrap the injured area with an elastic bandage for comfort. Be careful not to wrap it too tightly. This may interfere with blood circulation or increase swelling.   Only take over-the-counter or prescription medicines for pain, discomfort, or fever as directed by your caregiver.  SEEK MEDICAL CARE IF:   You have increasing pain or swelling in the injured area.  MAKE SURE YOU:    Understand these instructions.   Will watch your condition.   Will get help right away if you are not doing well or get worse.  Document Released: 09/10/2005 Document Revised: 12/03/2011 Document Reviewed: 09/22/2011  [employer] Patient Information 2014 [employer].

## 2013-04-06 ENCOUNTER — Other Ambulatory Visit: Payer: Self-pay | Admitting: Nurse Practitioner

## 2013-04-08 NOTE — Telephone Encounter (Signed)
Last seen 10/13/12  [facility]

## 2013-05-20 ENCOUNTER — Other Ambulatory Visit: Payer: Self-pay | Admitting: Nurse Practitioner

## 2013-06-12 ENCOUNTER — Encounter: Payer: Self-pay | Admitting: Family Medicine

## 2013-06-12 ENCOUNTER — Ambulatory Visit (INDEPENDENT_AMBULATORY_CARE_PROVIDER_SITE_OTHER): Payer: BC Managed Care – PPO | Admitting: Family Medicine

## 2013-06-12 VITALS — BP 121/74 | HR 83 | Temp 100.5°F | Ht 67.0 in | Wt 188.0 lb

## 2013-06-12 DIAGNOSIS — J069 Acute upper respiratory infection, unspecified: Secondary | ICD-10-CM

## 2013-06-12 MED ORDER — ESCITALOPRAM OXALATE 20 MG PO TABS: ORAL_TABLET | ORAL | Status: DC

## 2013-06-13 NOTE — Progress Notes (Signed)
  Subjective:    Patient ID: Rebekah Miller, female    DOB: 1971/05/03, 42 y.o.   MRN: [id number]  HPI URI Symptoms Onset: 4-5 days  Description: rhinorrhea, nasal congestion, cough  Modifying factors:  none  Symptoms Nasal discharge: yes Fever: no Sore throat: no Cough: yes Wheezing: no Ear pain: no GI symptoms: no Sick contacts: yes  Red Flags  Stiff neck: no Dyspnea: no Rash: no Swallowing difficulty: no  Sinusitis Risk Factors Headache/face pain: no Double sickening: no tooth pain: no  Allergy Risk Factors Sneezing: no Itchy scratchy throat: no Seasonal symptoms: no  Flu Risk Factors Headache: no muscle aches: no severe fatigue: no     Review of Systems  All other systems reviewed and are negative.       Objective:   Physical Exam  Constitutional: She appears well-developed and well-nourished.  HENT:  Head: Normocephalic and atraumatic.  Right Ear: External ear normal.  Left Ear: External ear normal.  +nasal erythema, rhinorrhea bilaterally, + post oropharyngeal erythema    Eyes: Conjunctivae are normal. Pupils are equal, round, and reactive to light.  Neck: Normal range of motion. Neck supple.  Cardiovascular: Normal rate and regular rhythm.   Pulmonary/Chest: Effort normal and breath sounds normal.  Abdominal: Soft.  Musculoskeletal: Normal range of motion.  Neurological: She is alert.  Skin: Skin is warm.          Assessment & Plan:  URI (upper respiratory infection)  Likely viral URI  Discussed supportive care and infectious/resp red flags.  Follow up as needed.

## 2013-09-25 ENCOUNTER — Ambulatory Visit: Payer: BC Managed Care – PPO | Admitting: Family Medicine

## 2013-09-28 ENCOUNTER — Ambulatory Visit: Payer: BC Managed Care – PPO | Admitting: Family Medicine

## 2013-12-01 ENCOUNTER — Ambulatory Visit: Payer: BC Managed Care – PPO | Admitting: General Practice

## 2013-12-08 ENCOUNTER — Ambulatory Visit (INDEPENDENT_AMBULATORY_CARE_PROVIDER_SITE_OTHER): Payer: BC Managed Care – PPO | Admitting: General Practice

## 2013-12-08 ENCOUNTER — Encounter: Payer: Self-pay | Admitting: General Practice

## 2013-12-08 VITALS — BP 132/76 | HR 62 | Temp 97.7°F | Ht 67.0 in | Wt 190.0 lb

## 2013-12-08 DIAGNOSIS — F32A Depression, unspecified: Secondary | ICD-10-CM

## 2013-12-08 DIAGNOSIS — F3289 Other specified depressive episodes: Secondary | ICD-10-CM

## 2013-12-08 DIAGNOSIS — F329 Major depressive disorder, single episode, unspecified: Secondary | ICD-10-CM

## 2013-12-08 DIAGNOSIS — F411 Generalized anxiety disorder: Secondary | ICD-10-CM

## 2013-12-08 MED ORDER — ESCITALOPRAM OXALATE 20 MG PO TABS: ORAL_TABLET | ORAL | Status: DC

## 2013-12-08 NOTE — Progress Notes (Signed)
   Subjective:    Patient ID: Rebekah Miller, female    DOB: 02/11/1971, 43 y.o.   MRN: [id number]  HPI Patient presents today for chronic health follow up. History of depression and anxiety. Taking lexapro and finds it very effective in managing moods and anxiety. Reports having more energy, increased interaction with others. She denies taking the medication for past month, due to beginning out of medication. Reports noticing a difference when off medication (lack of motivation, inability to sleep, mood swings). Denies thoughts of harming self or others.     Review of Systems  Constitutional: Negative for fever and chills.  Respiratory: Negative for chest tightness and shortness of breath.   Cardiovascular: Negative for chest pain and palpitations.  Neurological: Negative for dizziness, weakness and headaches.  Psychiatric/Behavioral: Positive for sleep disturbance. Negative for suicidal ideas. The patient is nervous/anxious.        Objective:   Physical Exam  Constitutional: She is oriented to person, place, and time. She appears well-developed and well-nourished.  Cardiovascular: Normal rate, regular rhythm and normal heart sounds.   Pulmonary/Chest: Effort normal and breath sounds normal. No respiratory distress. She exhibits no tenderness.  Neurological: She is alert and oriented to person, place, and time.  Skin: Skin is warm and dry.  Psychiatric: She has a normal mood and affect.          Assessment & Plan:  1. Generalized anxiety disorder, 2. Depression  - escitalopram (LEXAPRO) 20 MG tablet; TAKE 1 TABLET (20 MG TOTAL) BY MOUTH DAILY.  Dispense: 30 tablet; Refill: 5 -discussed stress management -increase physical activity and eat healthy diet RTO prn Patient verbalized understanding Coralie KeensMae E. Amal Renbarger, FNP-C

## 2013-12-08 NOTE — Patient Instructions (Signed)

## 2014-01-12 ENCOUNTER — Encounter: Payer: Self-pay | Admitting: General Practice

## 2014-01-12 ENCOUNTER — Ambulatory Visit (INDEPENDENT_AMBULATORY_CARE_PROVIDER_SITE_OTHER): Payer: BC Managed Care – PPO | Admitting: General Practice

## 2014-01-12 VITALS — BP 122/77 | HR 67 | Temp 98.5°F | Ht 67.0 in | Wt 187.6 lb

## 2014-01-12 DIAGNOSIS — Z0000 Encounter for general adult medical examination without abnormal findings: Secondary | ICD-10-CM

## 2014-01-12 DIAGNOSIS — Z01419 Encounter for gynecological examination (general) (routine) without abnormal findings: Secondary | ICD-10-CM

## 2014-01-12 DIAGNOSIS — Z124 Encounter for screening for malignant neoplasm of cervix: Secondary | ICD-10-CM

## 2014-01-12 DIAGNOSIS — H00019 Hordeolum externum unspecified eye, unspecified eyelid: Secondary | ICD-10-CM

## 2014-01-12 LAB — POCT CBC
Granulocyte percent: 65 %G (ref 37–80)
HCT, POC: 40.6 % (ref 37.7–47.9)
Hemoglobin: 13.5 g/dL (ref 12.2–16.2)
LYMPH, POC: 2.3 (ref 0.6–3.4)
MCH: 29.2 pg (ref 27–31.2)
MCHC: 33.2 g/dL (ref 31.8–35.4)
MCV: 87.9 fL (ref 80–97)
MPV: 6.8 fL (ref 0–99.8)
PLATELET COUNT, POC: 365 10*3/uL (ref 142–424)
POC Granulocyte: 5.4 (ref 2–6.9)
POC LYMPH %: 28.3 % (ref 10–50)
RBC: 4.6 M/uL (ref 4.04–5.48)
RDW, POC: 13.4 %
WBC: 8.3 10*3/uL (ref 4.6–10.2)

## 2014-01-12 MED ORDER — BACITRACIN-POLYMYXIN B 500-10000 UNIT/GM OP OINT: TOPICAL_OINTMENT | OPHTHALMIC | Status: DC

## 2014-01-12 NOTE — Progress Notes (Signed)
   Subjective:    Patient ID: Rebekah Miller, female    DOB: 29-May-1971, 43 y.o.   MRN: [id number]  HPI Patient presents today for annual exam (pap). History of anxiety and reports lexapro is effective in managing. Complains of stye to left lower eyelid, onset 8 months, not completely resolved. Denies OTC medications, reports keeping eye lid clean, but also continues to wear makeup.  Reports last menstrual cycle began January 05, 2014 and was regular.    Review of Systems  Constitutional: Negative for fever and chills.  Eyes: Negative for pain, redness, itching and visual disturbance.  Respiratory: Negative for chest tightness and shortness of breath.   Cardiovascular: Negative for chest pain and palpitations.  Gastrointestinal: Negative for nausea, vomiting, abdominal pain, diarrhea, constipation and blood in stool.  Genitourinary: Negative for dysuria, hematuria and difficulty urinating.  Neurological: Negative for dizziness, weakness and headaches.       Objective:   Physical Exam  Constitutional: She is oriented to person, place, and time. She appears well-developed and well-nourished.  HENT:  Head: Normocephalic and atraumatic.  Right Ear: External ear normal.  Left Ear: External ear normal.  Mouth/Throat: Oropharynx is clear and moist.  Eyes: Conjunctivae and EOM are normal. Pupils are equal, round, and reactive to light.  Neck: Normal range of motion. Neck supple. No thyromegaly present.  Cardiovascular: Normal rate, regular rhythm, normal heart sounds and intact distal pulses.   Pulmonary/Chest: Effort normal and breath sounds normal. No respiratory distress. She exhibits no tenderness. Right breast exhibits no inverted nipple, no mass, no nipple discharge, no skin change and no tenderness. Left breast exhibits no inverted nipple, no mass, no nipple discharge, no skin change and no tenderness. Breasts are symmetrical.  Abdominal: Soft. Bowel sounds are normal. She exhibits no  distension.  Genitourinary: Vagina normal and uterus normal. No breast swelling, tenderness, discharge or bleeding. No labial fusion. There is no rash, tenderness, lesion or injury on the right labia. There is no rash, tenderness, lesion or injury on the left labia. Uterus is not deviated, not enlarged, not fixed and not tender. Cervix exhibits no motion tenderness, no discharge and no friability. Right adnexum displays no mass, no tenderness and no fullness. Left adnexum displays no mass, no tenderness and no fullness. No erythema, tenderness or bleeding around the vagina. No foreign body around the vagina. No signs of injury around the vagina. No vaginal discharge found.  Lymphadenopathy:    She has no cervical adenopathy.  Neurological: She is alert and oriented to person, place, and time.  Skin: Skin is warm and dry.  Psychiatric: She has a normal mood and affect.          Assessment & Plan:  1. Visit for routine gyn exam  - Pap IG w/ reflex to HPV when ASC-U  2. Annual physical exam  - POCT CBC - CMP14+EGFR  3. Sty, external  - bacitracin-polymyxin b (POLYSPORIN) ophthalmic ointment; Apply 0.5 inch ribbon to left eye lid every 12 hours for 7 days.  Dispense: 3.5 g; Refill: 0 -discussed and provided patient education -RTO prn -Patient verbalized understanding Erby Pian, FNP-C

## 2014-01-12 NOTE — Patient Instructions (Addendum)
Pap Test A Pap test is a procedure done in a clinic office to evaluate cells that are on the surface of the cervix. The cervix is the lower portion of the uterus and upper portion of the vagina. For some women, the cervical region has the potential to form cancer. With consistent evaluations by your caregiver, this type of cancer can be prevented.  If a Pap test is abnormal, it is most often a result of a previous exposure to human papillomavirus (HPV). HPV is a virus that can infect the cells of the cervix and cause dysplasia. Dysplasia is where the cells no longer look normal. If a woman has been diagnosed with high-grade or severe dysplasia, they are at higher risk of developing cervical cancer. People diagnosed with low-grade dysplasia should still be seen by their caregiver because there is a small chance that low-grade dysplasia could develop into cancer.  LET YOUR CAREGIVER KNOW ABOUT:  Recent sexually transmitted infection (STI) you have had.  Any new sex partners you have had.  History of previous abnormal Pap tests results.  History of previous cervical procedures you have had (colposcopy, biopsy, loop electrosurgical excision procedure [LEEP]).  Concerns you have had regarding unusual vaginal discharge.  History of pelvic pain.  Your use of birth control. BEFORE THE PROCEDURE  Ask your caregiver when to schedule your Pap test. It is best not to be on your period if your caregiver uses a wooden spatula to collect cells or applies cells to a glass slide. Newer techniques are not so sensitive to the timing of a menstrual cycle.  Do not douche or have sexual intercourse for 24 hours before the test.   Do not use vaginal creams or tampons for 24 hours before the test.   Empty your bladder just before the test to lessen any discomfort.  PROCEDURE You will lie on an exam table with your feet in stirrups. A warm metal or plastic instrument (speculum) is placed in your vagina. This  instrument allows your caregiver to see the inside of your vagina and look at your cervix. A small, plastic brush or wooden spatula is then used to collect cervical cells. These cells are placed in a lab specimen container. The cells are looked at under a microscope. A specialist will determine if the cells are normal.  AFTER THE PROCEDURE Make sure to get your test results.If your results come back abnormal, you may need further testing.  Document Released: 12/01/2002 Document Revised: 12/03/2011 Document Reviewed: 09/06/2011 [facility]Exit[facility] Patient Information 2014 [place], [place].  Sty A sty (hordeolum) is an infection of a gland in the eyelid located at the base of the eyelash. A sty may develop a white or yellow head of pus. It can be puffy (swollen). Usually, the sty will burst and pus will come out on its own. They do not leave lumps in the eyelid once they drain. A sty is often confused with another form of cyst of the eyelid called a chalazion. Chalazions occur within the eyelid and not on the edge where the bases of the eyelashes are. They often are red, sore and then form firm lumps in the eyelid. CAUSES   Germs (bacteria).  Lasting (chronic) eyelid inflammation. SYMPTOMS   Tenderness, redness and swelling along the edge of the eyelid at the base of the eyelashes.  Sometimes, there is a white or yellow head of pus. It may or may not drain. DIAGNOSIS  An ophthalmologist will be able to distinguish between a  sty and a chalazion and treat the condition appropriately.  TREATMENT   Styes are typically treated with warm packs (compresses) until drainage occurs.  In rare cases, medicines that kill germs (antibiotics) may be prescribed. These antibiotics may be in the form of drops, cream or pills.  If a hard lump has formed, it is generally necessary to do a small incision and remove the hardened contents of the cyst in a minor surgical procedure done in the office.  In suspicious cases,  your caregiver may send the contents of the cyst to the lab to be certain that it is not a rare, but dangerous form of cancer of the glands of the eyelid. HOME CARE INSTRUCTIONS   Wash your hands often and dry them with a clean towel. Avoid touching your eyelid. This may spread the infection to other parts of the eye.  Apply heat to your eyelid for 10 to 20 minutes, several times a day, to ease pain and help to heal it faster.  Do not squeeze the sty. Allow it to drain on its own. Wash your eyelid carefully 3 to 4 times per day to remove any pus. SEEK IMMEDIATE MEDICAL CARE IF:   Your eye becomes painful or puffy (swollen).  Your vision changes.  Your sty does not drain by itself within 3 days.  Your sty comes back within a short period of time, even with treatment.  You have redness (inflammation) around the eye.  You have a fever. Document Released: 06/20/2005 Document Revised: 12/03/2011 Document Reviewed: 02/22/2009 [facility] IncExitCare Patient Information 2014 [place]ExitCare, [place]LLC.

## 2014-01-13 LAB — CMP14+EGFR
ALK PHOS: 69 IU/L (ref 39–117)
ALT: 18 IU/L (ref 0–32)
AST: 17 IU/L (ref 0–40)
Albumin/Globulin Ratio: 1.5 (ref 1.1–2.5)
Albumin: 4.4 g/dL (ref 3.5–5.5)
BUN / CREAT RATIO: 13 (ref 9–23)
BUN: 9 mg/dL (ref 6–24)
CALCIUM: 9.3 mg/dL (ref 8.7–10.2)
CHLORIDE: 100 mmol/L (ref 97–108)
CO2: 23 mmol/L (ref 18–29)
CREATININE: 0.7 mg/dL (ref 0.57–1.00)
GFR calc Af Amer: 125 mL/min/{1.73_m2} (ref >59)
GFR calc non Af Amer: 109 mL/min/{1.73_m2} (ref >59)
GLOBULIN, TOTAL: 3 g/dL (ref 1.5–4.5)
Glucose: 92 mg/dL (ref 65–99)
POTASSIUM: 4.7 mmol/L (ref 3.5–5.2)
SODIUM: 140 mmol/L (ref 134–144)
Total Bilirubin: 0.3 mg/dL (ref 0.0–1.2)
Total Protein: 7.4 g/dL (ref 6.0–8.5)

## 2014-01-16 LAB — PAP IG W/ RFLX HPV ASCU: PAP Smear Comment: 0

## 2014-01-16 LAB — HPV DNA PROBE HIGH RISK, AMPLIFIED: HPV, HIGH-RISK: NEGATIVE

## 2014-02-08 ENCOUNTER — Encounter: Payer: Self-pay | Admitting: Family Medicine

## 2014-02-08 ENCOUNTER — Ambulatory Visit (INDEPENDENT_AMBULATORY_CARE_PROVIDER_SITE_OTHER): Payer: BC Managed Care – PPO | Admitting: Family Medicine

## 2014-02-08 VITALS — BP 118/75 | HR 70 | Temp 99.2°F | Ht 67.0 in | Wt 186.2 lb

## 2014-02-08 DIAGNOSIS — J209 Acute bronchitis, unspecified: Secondary | ICD-10-CM

## 2014-02-08 MED ORDER — AZITHROMYCIN 250 MG PO TABS: ORAL_TABLET | ORAL | Status: DC

## 2014-02-08 MED ORDER — TRIAMCINOLONE ACETONIDE 40 MG/ML IJ SUSP
60.0000 mg | Freq: Once | INTRAMUSCULAR | Status: AC
  Administered 2014-02-08: 60 mg via INTRAMUSCULAR

## 2014-02-08 MED ORDER — BENZONATATE 100 MG PO CAPS: 100.0000 mg | ORAL_CAPSULE | Freq: Three times a day (TID) | ORAL | Status: DC | PRN

## 2014-02-08 NOTE — Progress Notes (Signed)
   Subjective:    Patient ID: Rebekah Miller, female    DOB: 04-12-1971, 43 y.o.   MRN: [id number]  HPI This 43 y.o. female presents for evaluation of cough and uri sx's.   Review of Systems C/o cough and uri sx's No chest pain, SOB, HA, dizziness, vision change, N/V, diarrhea, constipation, dysuria, urinary urgency or frequency, myalgias, arthralgias or rash.     Objective:   Physical Exam  Vital signs noted  Well developed well nourished female.  HEENT - Head atraumatic Normocephalic                Eyes - PERRLA, Conjuctiva - clear Sclera- Clear EOMI                Ears - EAC's Wnl TM's Wnl Gross Hearing WNL                Throat - oropharanx wnl Respiratory - Lungs CTA bilateral Cardiac - RRR S1 and S2 without murmur GI - Abdomen soft Nontender and bowel sounds active x 4      Assessment & Plan:  Acute bronchitis - Plan: azithromycin (ZITHROMAX) 250 MG tablet, benzonatate (TESSALON) 100 MG capsule, triamcinolone acetonide (KENALOG-40) injection 60 mg Push po fluids, rest, tylenol and motrin otc prn as directed for fever, arthralgias, and myalgias.  Follow up prn if sx's continue or persist.  Deatra CanterWilliam J Arpi Diebold FNP

## 2014-03-31 ENCOUNTER — Ambulatory Visit (INDEPENDENT_AMBULATORY_CARE_PROVIDER_SITE_OTHER): Payer: BC Managed Care – PPO | Admitting: Family Medicine

## 2014-03-31 ENCOUNTER — Encounter: Payer: Self-pay | Admitting: Family Medicine

## 2014-03-31 VITALS — BP 123/73 | HR 62 | Temp 98.2°F | Ht 67.0 in | Wt 186.0 lb

## 2014-03-31 DIAGNOSIS — H6122 Impacted cerumen, left ear: Secondary | ICD-10-CM

## 2014-03-31 DIAGNOSIS — H6120 Impacted cerumen, unspecified ear: Secondary | ICD-10-CM

## 2014-03-31 MED ORDER — CARBAMIDE PEROXIDE 6.5 % OT SOLN: 5.0000 [drp] | Freq: Two times a day (BID) | OTIC | Status: DC

## 2014-03-31 NOTE — Progress Notes (Signed)
   Subjective:    Patient ID: Rebekah Miller, female    DOB: [birth year], 43 y.o.   MRN: [id number]  HPI This 43 y.o. female presents for evaluation of left ear discomfort.   Review of Systems No chest pain, SOB, HA, dizziness, vision change, N/V, diarrhea, constipation, dysuria, urinary urgency or frequency, myalgias, arthralgias or rash.     Objective:   Physical Exam Vital signs noted  Well developed well nourished female.  HEENT - Head atraumatic Normocephalic                Eyes - PERRLA, Conjuctiva - clear Sclera- Clear EOMI                Ears - EAC left with large cerumen impaction and this is removed with ear wic and then EAC                           Clear and TM normal Right EAC and TM wnl                Nose - Nares patent                 Throat - oropharanx wnl Respiratory - Lungs CTA bilateral Cardiac - RRR S1 and S2 without murmur GI - Abdomen soft Nontender and bowel sounds active x 4 Extremities - No edema. Neuro - Grossly intact.       Assessment & Plan:  Cerumen impaction, left - Plan: carbamide peroxide (DEBROX) 6.5 % otic solution And cerumen manually removed from left EAC successfully.  Deatra CanterWilliam J Mekiyah Gladwell FNP

## 2015-08-21 ENCOUNTER — Emergency Department (HOSPITAL_BASED_OUTPATIENT_CLINIC_OR_DEPARTMENT_OTHER)
Admission: EM | Admit: 2015-08-21 | Discharge: 2015-08-21 | Disposition: A | Discharge status: Home / Self Care | Payer: BLUE CROSS/BLUE SHIELD | Attending: Emergency Medicine | Admitting: Emergency Medicine

## 2015-08-21 ENCOUNTER — Encounter (HOSPITAL_BASED_OUTPATIENT_CLINIC_OR_DEPARTMENT_OTHER): Payer: Self-pay | Admitting: *Deleted

## 2015-08-21 DIAGNOSIS — F329 Major depressive disorder, single episode, unspecified: Secondary | ICD-10-CM | POA: Insufficient documentation

## 2015-08-21 DIAGNOSIS — R1030 Lower abdominal pain, unspecified: Secondary | ICD-10-CM | POA: Diagnosis present

## 2015-08-21 DIAGNOSIS — Z79899 Other long term (current) drug therapy: Secondary | ICD-10-CM | POA: Diagnosis not present

## 2015-08-21 DIAGNOSIS — R197 Diarrhea, unspecified: Secondary | ICD-10-CM

## 2015-08-21 DIAGNOSIS — R195 Other fecal abnormalities: Secondary | ICD-10-CM | POA: Diagnosis not present

## 2015-08-21 LAB — CBC WITH DIFFERENTIAL/PLATELET
BASOS ABS: 0 10*3/uL (ref 0.0–0.1)
BASOS PCT: 0 %
Eosinophils Absolute: 0.2 10*3/uL (ref 0.0–0.7)
Eosinophils Relative: 2 %
HEMATOCRIT: 38.8 % (ref 36.0–46.0)
HEMOGLOBIN: 12.8 g/dL (ref 12.0–15.0)
Lymphocytes Relative: 14 %
Lymphs Abs: 1.4 10*3/uL (ref 0.7–4.0)
MCH: 29.2 pg (ref 26.0–34.0)
MCHC: 33 g/dL (ref 30.0–36.0)
MCV: 88.4 fL (ref 78.0–100.0)
Monocytes Absolute: 0.6 10*3/uL (ref 0.1–1.0)
Monocytes Relative: 6 %
NEUTROS ABS: 8.3 10*3/uL — AB (ref 1.7–7.7)
NEUTROS PCT: 78 %
Platelets: 290 10*3/uL (ref 150–400)
RBC: 4.39 MIL/uL (ref 3.87–5.11)
RDW: 12.9 % (ref 11.5–15.5)
WBC: 10.5 10*3/uL (ref 4.0–10.5)

## 2015-08-21 LAB — COMPREHENSIVE METABOLIC PANEL
ALBUMIN: 3.8 g/dL (ref 3.5–5.0)
ALK PHOS: 53 U/L (ref 38–126)
ALT: 41 U/L (ref 14–54)
AST: 25 U/L (ref 15–41)
Anion gap: 7 (ref 5–15)
BILIRUBIN TOTAL: 0.4 mg/dL (ref 0.3–1.2)
BUN: 10 mg/dL (ref 6–20)
CALCIUM: 8.8 mg/dL — AB (ref 8.9–10.3)
CO2: 23 mmol/L (ref 22–32)
Chloride: 105 mmol/L (ref 101–111)
Creatinine, Ser: 0.69 mg/dL (ref 0.44–1.00)
GFR calc Af Amer: >60 mL/min (ref >60)
GFR calc non Af Amer: >60 mL/min (ref >60)
GLUCOSE: 93 mg/dL (ref 65–99)
POTASSIUM: 3.5 mmol/L (ref 3.5–5.1)
Sodium: 135 mmol/L (ref 135–145)
TOTAL PROTEIN: 7.8 g/dL (ref 6.5–8.1)

## 2015-08-21 NOTE — ED Notes (Signed)
Presents with c/o going to the bathroom have bowel movement, having blood being passed, states it is bright red in color. States having multiple trips to the bathroom > 10 times today

## 2015-08-21 NOTE — ED Provider Notes (Signed)
CSN: [id number]     Arrival date & time 08/21/15  1437 History   First MD Initiated Contact with Patient 08/21/15 1539     Chief Complaint  Patient presents with  . Abdominal Pain     (Consider location/radiation/quality/duration/timing/severity/associated sxs/prior Treatment) HPI Comments: Patient presents with complaint of diarrhea and cramping abdominal pain for the past 2 days. No fevers, nausea, or vomiting. Patient is still eating and drinking well although she has less of an appetite. This morning at approximately 3 AM she began having bright red blood passing in her stools. She is not on any blood thinners. She continues to have lower abdominal cramping pain when she feels like she has to have a bowel movement. She denies any suspicious food or water exposures. No recent travel or camping. No recent antibiotic use. Patient has been passing stools approximately every 30 minutes. No history of bowel problems or inflammatory bowel disease. No history of abdominal surgeries. No known sick contacts. Onset of symptoms acute. Course is constant. Nothing makes symptoms better or worse. No treatments prior to arrival.  The history is provided by the patient.    Past Medical History  Diagnosis Date  . Depression    Past Surgical History  Procedure Laterality Date  . Cardiac valve replacement     Family History  Problem Relation Age of Onset  . Hyperlipidemia Mother   . Heart disease Mother   . Hypertension Mother   . COPD Father    Social History  Substance Use Topics  . Smoking status: Never Smoker   . Smokeless tobacco: None  . Alcohol Use: Yes     Comment: social   OB History    No data available     Review of Systems  Constitutional: Negative for fever.  HENT: Negative for rhinorrhea and sore throat.   Eyes: Negative for redness.  Respiratory: Negative for cough.   Cardiovascular: Negative for chest pain.  Gastrointestinal: Positive for abdominal pain (intermittent,  lower abdominal cramping), diarrhea and blood in stool. Negative for nausea and vomiting.  Genitourinary: Negative for dysuria.  Musculoskeletal: Negative for myalgias.  Skin: Negative for rash.  Neurological: Negative for headaches.   Allergies  Review of patient's allergies indicates no known allergies.  Home Medications   Prior to Admission medications   Medication Sig Start Date End Date Taking? Authorizing Provider  escitalopram (LEXAPRO) 20 MG tablet TAKE 1 TABLET (20 MG TOTAL) BY MOUTH DAILY. 12/08/13  Yes Mae Shelda AltesE [place], FNP  esomeprazole (NEXIUM) 20 MG capsule Take 20 mg by mouth daily at 12 noon.   Yes Historical Provider, MD  carbamide peroxide (DEBROX) 6.5 % otic solution Place 5 drops into both ears 2 (two) times daily. 03/31/14   Deatra CanterWilliam J Oxford, FNP   BP 127/82 mmHg  Pulse 72  Temp(Src) 98.2 F (36.8 C) (Oral)  Resp 18  Ht 5\' 7"  (1.702 m)  Wt 81.647 kg  BMI 28.19 kg/m2  SpO2 99%  LMP 07/04/2015 (Approximate) Physical Exam  Constitutional: She appears well-developed and well-nourished.  HENT:  Head: Normocephalic and atraumatic.  Eyes: Conjunctivae are normal. Right eye exhibits no discharge. Left eye exhibits no discharge.  Neck: Normal range of motion. Neck supple.  Cardiovascular: Normal rate, regular rhythm and normal heart sounds.   No murmur heard. Pulmonary/Chest: Effort normal and breath sounds normal. No respiratory distress. She has no wheezes. She has no rales.  Abdominal: Soft. Bowel sounds are normal. There is tenderness (Mild, lower abdomen bilaterally). There  is no rebound and no guarding.  Neurological: She is alert.  Skin: Skin is warm and dry.  Psychiatric: She has a normal mood and affect.  Nursing note and vitals reviewed.   ED Course  Procedures (including critical care time) Labs Review Labs Reviewed  CBC WITH DIFFERENTIAL/PLATELET - Abnormal; Notable for the following:    Neutro Abs 8.3 (*)    All other components within normal  limits  COMPREHENSIVE METABOLIC PANEL - Abnormal; Notable for the following:    Calcium 8.8 (*)    All other components within normal limits  GI PATHOGEN PANEL BY PCR, STOOL    Imaging Review No results found. I have personally reviewed and evaluated these images and lab results as part of my medical decision-making.   EKG Interpretation None       4:19 PM Patient seen and examined. Work-up initiated. Patient appears well, nontoxic. Minimal abdominal tenderness on exam. Will check lab work.  Vital signs reviewed and are as follows: BP 127/82 mmHg  Pulse 72  Temp(Src) 98.2 F (36.8 C) (Oral)  Resp 18  Ht  (1.702 m)  Wt 81.647 kg  BMI 28.19 kg/m2  SpO2 99%  LMP 07/04/2015 (Approximate)  7:18 PM patient has remained stable during emergency department stay. She was able to give [place] a stool sample for GI pathogen panel. Patient is tolerating oral fluids without vomiting. Vital signs are stable. Labs are reassuring.  Discussed supportive and symptomatic care of diarrhea with patient. Encouraged to continue fluids and good oral intake.  Patient counseled to follow-up with PCP in 2 days for recheck if not improved. Return to emergency department with fever, worsening abdominal pain, vomiting, if larger amounts of blood are being passed in the stool. Patient verbalizes understanding and agrees with plan.   MDM   Final diagnoses:  Bloody diarrhea   Patient with bloody diarrhea. Minimal lower abdominal cramping. No focal pain. Hemoglobin is normal. Vital signs are normal. No fever. Patient appears well, nontoxic. She is tolerating fluids. GI pathogen panel is pending. Feel that she is stable for discharge home at this point. Return instructions as above. Encouraged PCP follow-up to ensure resolution.   Renne Crigler, PA-C 08/21/15 1920  Rolan Bucco, MD 08/21/15 [phone]

## 2015-08-21 NOTE — Discharge Instructions (Signed)
Please read and follow all provided instructions.  Your diagnoses today include:  1. Bloody diarrhea     Tests performed today include:  Blood counts and electrolytes - no concerning findings  Blood tests to check liver and kidney function  GI pathogen panel - results take a few days to return  Vital signs. See below for your results today.   Medications prescribed:   None  Take any prescribed medications only as directed.  Home care instructions:   Follow any educational materials contained in this packet.   Follow-up instructions: Please follow-up with your primary care provider in the next 2 days for further evaluation of your symptoms. If you are not feeling better in 48 hours you may have a condition that is more serious and you need re-evaluation.   Return instructions:  SEEK IMMEDIATE MEDICAL ATTENTION IF:  If you have pain that does not go away or becomes severe   A temperature above 101F develops   Repeated vomiting occurs (multiple episodes)   If you have pain that becomes localized to portions of the abdomen. The right side could possibly be appendicitis. In an adult, the left lower portion of the abdomen could be colitis or diverticulitis.   You develop chest pain, difficulty breathing, dizziness or fainting, or become confused, poorly responsive, or inconsolable (young children)  If you have any other emergent concerns regarding your health  Additional Information: Abdominal (belly) pain can be caused by many things. Your caregiver performed an examination and possibly ordered blood/urine tests and imaging (CT scan, x-rays, ultrasound). Many cases can be observed and treated at home after initial evaluation in the emergency department. Even though you are being discharged home, abdominal pain can be unpredictable. Therefore, you need a repeated exam if your pain does not resolve, returns, or worsens. Most patients with abdominal pain don't have to be admitted  to the hospital or have surgery, but serious problems like appendicitis and gallbladder attacks can start out as nonspecific pain. Many abdominal conditions cannot be diagnosed in one visit, so follow-up evaluations are very important.  Your vital signs today were: BP 127/68 mmHg   Pulse 67   Temp(Src) 99 F (37.2 C) (Oral)   Resp 18   Ht 5\' 7"  (1.702 m)   Wt 81.647 kg   BMI 28.19 kg/m2   SpO2 100%   LMP 07/04/2015 (Approximate) If your blood pressure (bp) was elevated above 135/85 this visit, please have this repeated by your doctor within one month. --------------

## 2017-01-09 ENCOUNTER — Other Ambulatory Visit: Payer: Self-pay | Admitting: Ophthalmology

## 2017-05-26 ENCOUNTER — Emergency Department (HOSPITAL_COMMUNITY): Payer: BLUE CROSS/BLUE SHIELD

## 2017-05-26 ENCOUNTER — Encounter (HOSPITAL_COMMUNITY): Payer: Self-pay | Admitting: Nurse Practitioner

## 2017-05-26 ENCOUNTER — Emergency Department (HOSPITAL_COMMUNITY)
Admission: EM | Admit: 2017-05-26 | Discharge: 2017-05-26 | Disposition: A | Discharge status: Home / Self Care | Payer: BLUE CROSS/BLUE SHIELD | Attending: Emergency Medicine | Admitting: Emergency Medicine

## 2017-05-26 DIAGNOSIS — S46912A Strain of unspecified muscle, fascia and tendon at shoulder and upper arm level, left arm, initial encounter: Secondary | ICD-10-CM | POA: Insufficient documentation

## 2017-05-26 DIAGNOSIS — Z79899 Other long term (current) drug therapy: Secondary | ICD-10-CM | POA: Insufficient documentation

## 2017-05-26 DIAGNOSIS — Y9389 Activity, other specified: Secondary | ICD-10-CM | POA: Diagnosis not present

## 2017-05-26 DIAGNOSIS — S161XXA Strain of muscle, fascia and tendon at neck level, initial encounter: Secondary | ICD-10-CM | POA: Diagnosis not present

## 2017-05-26 DIAGNOSIS — Y92410 Unspecified street and highway as the place of occurrence of the external cause: Secondary | ICD-10-CM | POA: Insufficient documentation

## 2017-05-26 DIAGNOSIS — Y999 Unspecified external cause status: Secondary | ICD-10-CM | POA: Insufficient documentation

## 2017-05-26 MED ORDER — ONDANSETRON 4 MG PO TBDP
4.0000 mg | ORAL_TABLET | Freq: Once | ORAL | Status: AC
  Administered 2017-05-26: 4 mg via ORAL

## 2017-05-26 MED ORDER — CYCLOBENZAPRINE HCL 10 MG PO TABS: 10.0000 mg | ORAL_TABLET | Freq: Two times a day (BID) | ORAL | 0 refills | Status: DC | PRN

## 2017-05-26 MED ORDER — DICLOFENAC SODIUM 50 MG PO TBEC: 50.0000 mg | DELAYED_RELEASE_TABLET | Freq: Two times a day (BID) | ORAL | 0 refills | Status: DC

## 2017-05-26 MED ORDER — HYDROCODONE-ACETAMINOPHEN 5-325 MG PO TABS
1.0000 | ORAL_TABLET | Freq: Once | ORAL | Status: AC
  Administered 2017-05-26: 1 via ORAL
  Filled 2017-05-26: qty 1

## 2017-05-26 NOTE — Discharge Instructions (Signed)
Your x-rays show no fracture or dislocation. Follow up with your doctor. Return here for worsening symptoms.  Do not drive while taking the muscle relaxant as it will make you sleepy.
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
## 2017-09-24 HISTORY — PX: CERVICAL SPINE SURGERY: SHX589

## 2019-02-07 ENCOUNTER — Emergency Department (HOSPITAL_COMMUNITY): Payer: Managed Care, Other (non HMO)

## 2019-02-07 ENCOUNTER — Emergency Department (HOSPITAL_COMMUNITY)
Admission: EM | Admit: 2019-02-07 | Discharge: 2019-02-07 | Disposition: A | Discharge status: Home / Self Care | Payer: Managed Care, Other (non HMO) | Attending: Emergency Medicine | Admitting: Emergency Medicine

## 2019-02-07 ENCOUNTER — Other Ambulatory Visit: Payer: Self-pay

## 2019-02-07 ENCOUNTER — Encounter (HOSPITAL_COMMUNITY): Payer: Self-pay | Admitting: Emergency Medicine

## 2019-02-07 DIAGNOSIS — J020 Streptococcal pharyngitis: Secondary | ICD-10-CM | POA: Diagnosis not present

## 2019-02-07 DIAGNOSIS — F17220 Nicotine dependence, chewing tobacco, uncomplicated: Secondary | ICD-10-CM | POA: Diagnosis not present

## 2019-02-07 DIAGNOSIS — Z20828 Contact with and (suspected) exposure to other viral communicable diseases: Secondary | ICD-10-CM | POA: Insufficient documentation

## 2019-02-07 DIAGNOSIS — Z79899 Other long term (current) drug therapy: Secondary | ICD-10-CM | POA: Insufficient documentation

## 2019-02-07 DIAGNOSIS — R509 Fever, unspecified: Secondary | ICD-10-CM | POA: Diagnosis present

## 2019-02-07 LAB — GROUP A STREP BY PCR: Group A Strep by PCR: DETECTED — AB

## 2019-02-07 MED ORDER — PENICILLIN G BENZATHINE 1200000 UNIT/2ML IM SUSP
1.2000 10*6.[IU] | Freq: Once | INTRAMUSCULAR | Status: AC
  Administered 2019-02-07: 1.2 10*6.[IU] via INTRAMUSCULAR
  Filled 2019-02-07: qty 2

## 2019-02-07 NOTE — ED Provider Notes (Signed)
[facility] EMERGENCY DEPARTMENT Provider Note   CSN: [id number] Arrival date & time: 02/07/19  1148    History   Chief Complaint Chief Complaint  Patient presents with  . Fever  . Cough  . Sore Throat  . Shortness of Breath    HPI Rebekah Miller is a 48 y.o. female.     Sx started yesterday.  Fever up to 102.   Some myalgias.  Sore throat as well.  Mild shortness of breath.  Son is ill with strep throat.   Denies large crowd exposure, known covid.   URI  Presenting symptoms: cough and fever   Cough:    Cough characteristics:  Productive   Sputum characteristics:  Nondescript   Severity:  Moderate   Onset quality:  Sudden   Duration:  1 day   Timing:  Intermittent Duration:  1 day Timing:  Intermittent Chronicity:  New   Past Medical History:  Diagnosis Date  . Depression     Patient Active Problem List   Diagnosis Date Noted  . OBESITY, UNSPECIFIED 09/30/2008  . 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 ANXIETY STATE, UNSPECIFIED 12/19/2007  . DEPRESSION, MAJOR, RECURRENT 11/21/2006  . HIRSUTISM 11/21/2006    Past Surgical History:  Procedure Laterality Date  . CARDIAC VALVE REPLACEMENT       OB History   No obstetric history on file.      Home Medications    Prior to Admission medications   Medication Sig Start Date End Date Taking? Authorizing Provider  cyclobenzaprine (FLEXERIL) 10 MG tablet Take 1 tablet (10 mg total) by mouth 2 (two) times daily as needed for muscle spasms. 05/26/17   Janne Napoleon, NP  diclofenac (VOLTAREN) 50 MG EC tablet Take 1 tablet (50 mg total) by mouth 2 (two) times daily. 05/26/17   Janne Napoleon, NP  escitalopram (LEXAPRO) 20 MG tablet TAKE 1 TABLET (20 MG TOTAL) BY MOUTH DAILY. 12/08/13   Coralie Keens, FNP  esomeprazole (NEXIUM) 20 MG capsule Take 20 mg by mouth daily at 12 noon.    [provider]    Family History Family History  Problem Relation Age of Onset  .  Hyperlipidemia Mother   . Heart disease Mother   . Hypertension Mother   . COPD Father     Social History Social History   Tobacco Use  . Smoking status: Current Every Day Smoker  . Smokeless tobacco: Current User  Substance Use Topics  . Alcohol use: Yes    Comment: social  . Drug use: No     Allergies   Patient has no known allergies.   Review of Systems Review of Systems  Constitutional: Positive for fever.  Respiratory: Positive for cough.   All other systems reviewed and are negative.    Physical Exam Updated Vital Signs BP 112/73   Pulse 85   Temp 100 F (37.8 C) (Oral)   Resp 18   LMP 02/06/2019   SpO2 98%   Physical Exam Vitals signs and nursing note reviewed.  Constitutional:      General: She is not in acute distress.    Appearance: She is well-developed.  HENT:     Head: Normocephalic and atraumatic.     Right Ear: External ear normal.     Left Ear: External ear normal.     Nose: No rhinorrhea.     Mouth/Throat:     Pharynx: Posterior oropharyngeal erythema present. No oropharyngeal exudate.  Eyes:     General: No scleral icterus.       Right eye: No discharge.        Left eye: No discharge.     Conjunctiva/sclera: Conjunctivae normal.  Neck:     Musculoskeletal: Neck supple.     Trachea: No tracheal deviation.  Cardiovascular:     Rate and Rhythm: Normal rate and regular rhythm.  Pulmonary:     Effort: Pulmonary effort is normal. No respiratory distress.     Breath sounds: Normal breath sounds. No stridor. No wheezing or rales.  Abdominal:     General: Bowel sounds are normal. There is no distension.     Palpations: Abdomen is soft.     Tenderness: There is no abdominal tenderness. There is no guarding or rebound.  Musculoskeletal:        General: No tenderness.  Skin:    General: Skin is warm and dry.     Findings: No rash.  Neurological:     Mental Status: She is alert.     Cranial Nerves: No cranial nerve deficit (no facial  droop, extraocular movements intact, no slurred speech).     Sensory: No sensory deficit.     Motor: No abnormal muscle tone or seizure activity.     Coordination: Coordination normal.      ED Treatments / Results  Labs (all labs ordered are listed, but only abnormal results are displayed) Labs Reviewed  GROUP A STREP BY PCR - Abnormal; Notable for the following components:      Result Value   Group A Strep by PCR DETECTED (*)    All other components within normal limits  NOVEL CORONAVIRUS, NAA (HOSPITAL ORDER, SEND-OUT TO REF LAB)    EKG None  Radiology Dg Chest Portable 1 View  Result Date: 02/07/2019 CLINICAL DATA:  Fever, shortness of breath and sore throat. EXAM: PORTABLE CHEST 1 VIEW COMPARISON:  08/29/2009 FINDINGS: The heart size and mediastinal contours are within normal limits. Both lungs are clear. The visualized skeletal structures are unremarkable. IMPRESSION: No active disease. Electronically Signed   By: Signa Kellaylor  Stroud M.D.   On: 02/07/2019 13:48    Procedures Procedures (including critical care time)  Medications Ordered in ED Medications  penicillin g benzathine (BICILLIN LA) 1200000 UNIT/2ML injection 1.2 Million Units (has no administration in time range)     Initial Impression / Assessment and Plan / ED Course  I have reviewed the triage vital signs and the nursing notes.  Pertinent labs & imaging results that were available during my care of the patient were reviewed by me and considered in my medical decision making (see chart for details).     The patient's laboratory tests are notable for positive strep throat.  Chest x-ray is negative.  A coronavirus test has been sent off but her symptoms are most likely related to strep throat.  Patient is otherwise nontoxic and well-appearing.  Patient was given a dose of penicillin IM.  Stable for discharge.  Rebekah Miller was evaluated in Emergency Department on 02/07/2019 for the symptoms described in the  history of present illness. She was evaluated in the context of the global COVID-19 pandemic, which necessitated consideration that the patient might be at risk for infection with the SARS-CoV-2 virus that causes COVID-19. Institutional protocols and algorithms that pertain to the evaluation of patients at risk for COVID-19 are in a state of rapid change based on information released by regulatory bodies including the CDC and federal  and state organizations. These policies and algorithms were followed during the patient's care in the ED.   Final Clinical Impressions(s) / ED Diagnoses   Final diagnoses:  Pharyngitis due to Streptococcus species    ED Discharge Orders    None       Linwood Dibbles, MD 02/07/19 1432

## 2019-02-07 NOTE — ED Triage Notes (Signed)
Pt. Stated, Ive started running a fever, SOB, and sore throat. Has been taking Nyquil

## 2019-02-07 NOTE — ED Notes (Signed)
Pt updating family on personal cell phone.

## 2019-02-07 NOTE — ED Notes (Signed)
Discharge instructions discussed with Pt. Pt verbalized understanding. Pt stable and ambulatory.    

## 2019-02-07 NOTE — Discharge Instructions (Addendum)
The test did show you were positive for strep throat.  You were given a dose of penicillin to treat the strep throat infection.  You should start to feel better in the next few days.  Follow-up with the primary care doctor or return to the ED if you are having worsening symptoms, throat swelling difficulty swallowing or speaking

## 2019-02-08 LAB — NOVEL CORONAVIRUS, NAA (HOSP ORDER, SEND-OUT TO REF LAB; TAT 18-24 HRS): SARS-CoV-2, NAA: NOT DETECTED

## 2019-12-11 ENCOUNTER — Other Ambulatory Visit: Payer: Self-pay

## 2019-12-11 ENCOUNTER — Ambulatory Visit: Payer: Managed Care, Other (non HMO) | Attending: Internal Medicine

## 2019-12-11 DIAGNOSIS — Z23 Encounter for immunization: Secondary | ICD-10-CM

## 2019-12-11 NOTE — Progress Notes (Signed)
   Covid-19 Vaccination Clinic  Name:  Rebekah Miller    MRN: [id number] DOB: 1971/01/23  12/11/2019  [name] was observed post Covid-19 immunization for 15 minutes without incident. She was provided with Vaccine Information Sheet and instruction to access the V-Safe system.   [name] was instructed to call 911 with any severe reactions post vaccine: Marland Kitchen Difficulty breathing  . Swelling of face and throat  . A fast heartbeat  . A bad rash all over body  . Dizziness and weakness   Immunizations Administered    Name Date Dose VIS Date Route   Pfizer COVID-19 Vaccine 12/11/2019  3:27 PM 0.3 mL 09/04/2019 Intramuscular   Manufacturer: [employer], [employer]   Lot: [id number]   NDC: [address]-7

## 2020-01-05 ENCOUNTER — Ambulatory Visit: Payer: Managed Care, Other (non HMO)

## 2020-01-06 ENCOUNTER — Ambulatory Visit: Payer: Managed Care, Other (non HMO) | Attending: Internal Medicine

## 2020-01-06 DIAGNOSIS — Z23 Encounter for immunization: Secondary | ICD-10-CM

## 2020-01-06 NOTE — Progress Notes (Signed)
   Covid-19 Vaccination Clinic  Name:  Rebekah Miller    MRN: [id number] DOB: [birth year]  01/06/2020  [name] was observed post Covid-19 immunization for 15 minutes without incident. She was provided with Vaccine Information Sheet and instruction to access the V-Safe system.   [name] was instructed to call 911 with any severe reactions post vaccine: Marland Kitchen Difficulty breathing  . Swelling of face and throat  . A fast heartbeat  . A bad rash all over body  . Dizziness and weakness   Immunizations Administered    Name Date Dose VIS Date Route   Pfizer COVID-19 Vaccine 01/06/2020  4:15 PM 0.3 mL 09/04/2019 Intramuscular   Manufacturer: [employer], [employer]   Lot: [id number]   NDC: [address]-5

## 2022-09-24 DIAGNOSIS — U071 COVID-19: Secondary | ICD-10-CM

## 2022-09-24 HISTORY — DX: COVID-19: U07.1

## 2022-10-07 ENCOUNTER — Other Ambulatory Visit: Payer: Self-pay

## 2022-10-07 ENCOUNTER — Emergency Department (HOSPITAL_BASED_OUTPATIENT_CLINIC_OR_DEPARTMENT_OTHER)
Admission: EM | Admit: 2022-10-07 | Discharge: 2022-10-07 | Disposition: A | Discharge status: Home / Self Care | Payer: BC Managed Care – PPO | Attending: Emergency Medicine | Admitting: Emergency Medicine

## 2022-10-07 ENCOUNTER — Emergency Department (HOSPITAL_BASED_OUTPATIENT_CLINIC_OR_DEPARTMENT_OTHER): Payer: BC Managed Care – PPO | Admitting: Radiology

## 2022-10-07 ENCOUNTER — Encounter (HOSPITAL_BASED_OUTPATIENT_CLINIC_OR_DEPARTMENT_OTHER): Payer: Self-pay | Admitting: Emergency Medicine

## 2022-10-07 DIAGNOSIS — M542 Cervicalgia: Secondary | ICD-10-CM | POA: Diagnosis present

## 2022-10-07 MED ORDER — OXYCODONE HCL 5 MG PO TABS
5.0000 mg | ORAL_TABLET | ORAL | Status: AC
  Administered 2022-10-07: 5 mg via ORAL
  Filled 2022-10-07: qty 1

## 2022-10-07 MED ORDER — LIDOCAINE 5 % EX PTCH: 1.0000 | MEDICATED_PATCH | CUTANEOUS | 0 refills | Status: DC

## 2022-10-07 MED ORDER — ACETAMINOPHEN 500 MG PO TABS
1000.0000 mg | ORAL_TABLET | Freq: Once | ORAL | Status: AC
  Administered 2022-10-07: 1000 mg via ORAL
  Filled 2022-10-07: qty 2

## 2022-10-07 MED ORDER — OXYCODONE HCL 5 MG PO TABS: 5.0000 mg | ORAL_TABLET | ORAL | 0 refills | Status: DC | PRN

## 2022-10-07 MED ORDER — LIDOCAINE 5 % EX PTCH
1.0000 | MEDICATED_PATCH | CUTANEOUS | Status: DC
  Administered 2022-10-07: 1 via TRANSDERMAL
  Filled 2022-10-07: qty 1

## 2022-10-07 NOTE — Discharge Instructions (Signed)
You were seen for your neck pain in the emergency department.   At home, please take tylenol, ibuprofen, and lidocaine patches for pain.  You may also take the oxycodone we have prescribed you for any breakthrough pain that may have.  Do not take this before driving or operating heavy machinery.  Do not take this medication with alcohol.  Follow-up with your primary doctor in 2-3 days regarding your visit.  Follow-up with neurosurgery regarding your pain.   Return immediately to the emergency department if you experience any of the following: weakness, bowel or bladder incontinence, or any other concerning symptoms.    Thank you for visiting our Emergency Department. It was a pleasure taking care of you today.

## 2022-10-07 NOTE — ED Triage Notes (Signed)
Constant neck pain for few days. No trauma or falls.

## 2022-10-07 NOTE — ED Provider Notes (Signed)
[facility] EMERGENCY DEPT Provider Note   CSN: [id number] Arrival date & time: 10/07/22  0719     History  Chief Complaint  Patient presents with   Neck Pain    LUKE RIGSBEE is a 52 y.o. female.  52 year old female with degenerative disc disease and cervical disc arthroplasty in 2019 of C5 and C6 who presents to the emergency department with neck pain.  Patient states that 3 days ago she started experiencing neck pain on her right side.  Radiates to her right shoulder.  Worsened with movement.  No trauma or injuries noted.  Denies any new numbness or weakness of her arms or legs.  No new bowel or bladder incontinence.  Denies any change in sensation when wiping.  No fevers.  No history of IVDU, cancer, immunocompromise state.  Has not yet taken any medication for it at home.  Initial spinal fusion was with Dr. Iline Oven from [facility].       Home Medications Prior to Admission medications   Medication Sig Start Date End Date Taking? Authorizing Provider  lidocaine (LIDODERM) 5 % Place 1 patch onto the skin daily. Remove & Discard patch within 12 hours or as directed by MD 10/07/22  Yes Rondel Baton, MD  oxyCODONE (ROXICODONE) 5 MG immediate release tablet Take 1 tablet (5 mg total) by mouth every 4 (four) hours as needed for severe pain. 10/07/22  Yes Rondel Baton, MD  cyclobenzaprine (FLEXERIL) 10 MG tablet Take 1 tablet (10 mg total) by mouth 2 (two) times daily as needed for muscle spasms. 05/26/17   Janne Napoleon, NP  diclofenac (VOLTAREN) 50 MG EC tablet Take 1 tablet (50 mg total) by mouth 2 (two) times daily. 05/26/17   Janne Napoleon, NP  escitalopram (LEXAPRO) 20 MG tablet TAKE 1 TABLET (20 MG TOTAL) BY MOUTH DAILY. 12/08/13   Coralie Keens, FNP  esomeprazole (NEXIUM) 20 MG capsule Take 20 mg by mouth daily at 12 noon.    [provider]      Allergies    Patient has no known allergies.    Review of Systems   Review of  Systems  Physical Exam Updated Vital Signs BP (!) 149/85 (BP Location: Right Arm)   Pulse 61   Temp 98 F (36.7 C) (Oral)   Resp 16   SpO2 98%  Physical Exam Vitals and nursing note reviewed.  Constitutional:      General: She is not in acute distress.    Appearance: She is well-developed.  HENT:     Head: Normocephalic and atraumatic.     Right Ear: External ear normal.     Left Ear: External ear normal.     Nose: Nose normal.  Eyes:     Extraocular Movements: Extraocular movements intact.     Conjunctiva/sclera: Conjunctivae normal.     Pupils: Pupils are equal, round, and reactive to light.  Neck:     Comments: Pain limits range of motion of neck Cardiovascular:     Rate and Rhythm: Normal rate and regular rhythm.     Heart sounds: No murmur heard. Pulmonary:     Effort: Pulmonary effort is normal. No respiratory distress.     Breath sounds: Normal breath sounds.  Abdominal:     Palpations: There is no mass.  Musculoskeletal:     Right lower leg: No edema.     Left lower leg: No edema.     Comments: Right paraspinal tenderness to palpation and  tenderness palpation of the right trapezius.  No midline C-spine tenderness to palpation or thoracic spine tenderness to palpation or step-offs noted.  Skin:    General: Skin is warm and dry.  Neurological:     Mental Status: She is alert and oriented to person, place, and time. Mental status is at baseline.     Comments: Intact sensation to light touch of the radial, median and ulnar nerves demonstrated by testing in the dorsal web space of the thumb, the hypothenar eminence of the palm, and the radial aspect of the dorsum of the hand.  Intact sensation of deltoids and upper arm dermatomes bilaterally.  Intact motor function of the radial, median and ulnar nerves demonstrated by strength of hand grip, and spreading of the 2nd through 5th digits, thumb apposition, and ability to make "OK sign".  5/5 strength in deltoid abduction,  elbow flexion and extension bilaterally.  Motor: Muscle bulk and tone are normal. Strength is 5/5 in hip flexion, knee flexion and extension, ankle dorsiflexion and plantar flexion bilaterally. Full strength of great toe dorsiflexion bilaterally.  Sensory: Intact sensation to light touch in L2 though S1 dermatomes bilaterally.    Psychiatric:        Mood and Affect: Mood normal.     ED Results / Procedures / Treatments   Labs (all labs ordered are listed, but only abnormal results are displayed) Labs Reviewed - No data to display  EKG None  Radiology DG Cervical Spine Complete  Result Date: 10/07/2022 CLINICAL DATA:  Persistent neck pain.  No trauma. EXAM: CERVICAL SPINE - COMPLETE 4+ VIEW COMPARISON:  Prior radiographs of the cervical spine 05/26/2017 FINDINGS: Surgical changes of discectomy and prosthetic disc placement at C5-C6. There is loss of the normal cervical lordosis. Additionally, new mild anterolisthesis of C2 on C3, C3 on C4 measuring between 2 and 3 mm. No evidence of fracture or malalignment. No significant new adjacent level changes. Prevertebral soft tissues are normal. IMPRESSION: 1. Compared to prior imaging from 2018, interval changes of C5-C6 discectomy and prosthetic disc placement. 2. Loss of the normal cervical lordosis with mild anterolisthesis of C2 on C3 and C3 on C4. 3. No evidence of fracture, malalignment or significant adjacent level disease. Electronically Signed   By: Jacqulynn Cadet M.D.   On: 10/07/2022 09:31    Procedures Procedures   Medications Ordered in ED Medications  acetaminophen (TYLENOL) tablet 1,000 mg (1,000 mg Oral Given 10/07/22 0912)  oxyCODONE (Oxy IR/ROXICODONE) immediate release tablet 5 mg (5 mg Oral Given 10/07/22 0913)    ED Course/ Medical Decision Making/ A&P                            Medical Decision Making Amount and/or Complexity of Data Reviewed Radiology: ordered.  Risk OTC drugs. Prescription drug  management.   KYNSLEY WHITEHOUSE is a 52 y.o. female with comorbidities that complicate the patient evaluation including degenerative disc disease and cervical disc arthroplasty in 2019 of C5 and C6 who presents to the emergency department with neck pain.   Initial Ddx:  Spinal cord compression, cervical radiculopathy, pathologic fracture, spinal epidural abscess  MDM:  Do not feel that patient has spinal cord compression at this time given history and exam.  No risk factors for pathologic fracture or spinal epidural abscess.  Feel that patient's symptoms are likely due to cervical radiculopathy.  Will obtain x-rays to assess for gross abnormality and treat patient's pain.  Plan:  Oxycodone Tylenol Lidocaine patch C-spine x-ray  ED Summary/Re-evaluation:  Patient underwent the above evaluation.  Did not show any acute abnormalities.  Upon reevaluation was feeling better with the above interventions and was instructed to follow-up with her spine surgeon as an outpatient.  Was given a short course of pain medication to take at home.  This patient presents to the ED for concern of complaints listed in HPI, this involves an extensive number of treatment options, and is a complaint that carries with it a high risk of complications and morbidity. Disposition including potential need for admission considered.   Dispo: DC Home. Return precautions discussed including, but not limited to, those listed in the AVS. Allowed pt time to ask questions which were answered fully prior to dc.  Additional history obtained from son Records reviewed Outpatient Clinic Notes I independently reviewed the following imaging with scope of interpretation limited to determining acute life threatening conditions related to emergency care:  Cervical spine x-ray  and agree with the radiologist interpretation with the following exceptions: None I personally reviewed and interpreted cardiac monitoring: normal sinus rhythm  I  personally reviewed and interpreted the pt's EKG: see above for interpretation  I have reviewed the patients home medications and made adjustments as needed  Final Clinical Impression(s) / ED Diagnoses Final diagnoses:  Neck pain, acute    Rx / DC Orders ED Discharge Orders          Ordered    oxyCODONE (ROXICODONE) 5 MG immediate release tablet  Every 4 hours PRN        10/07/22 1040    lidocaine (LIDODERM) 5 %  Every 24 hours        10/07/22 1040              Fransico Meadow, MD 10/07/22 [phone]

## 2022-10-10 ENCOUNTER — Emergency Department
Admission: EM | Admit: 2022-10-10 | Discharge: 2022-10-10 | Disposition: A | Discharge status: Home / Self Care | Payer: BC Managed Care – PPO | Attending: Emergency Medicine | Admitting: Emergency Medicine

## 2022-10-10 ENCOUNTER — Emergency Department: Payer: BC Managed Care – PPO

## 2022-10-10 ENCOUNTER — Other Ambulatory Visit: Payer: Self-pay

## 2022-10-10 DIAGNOSIS — M5010 Cervical disc disorder with radiculopathy, unspecified cervical region: Secondary | ICD-10-CM | POA: Insufficient documentation

## 2022-10-10 DIAGNOSIS — M5020 Other cervical disc displacement, unspecified cervical region: Secondary | ICD-10-CM

## 2022-10-10 DIAGNOSIS — M542 Cervicalgia: Secondary | ICD-10-CM | POA: Diagnosis present

## 2022-10-10 DIAGNOSIS — M5412 Radiculopathy, cervical region: Secondary | ICD-10-CM

## 2022-10-10 MED ORDER — HYDROCODONE-ACETAMINOPHEN 5-325 MG PO TABS: 1.0000 | ORAL_TABLET | Freq: Four times a day (QID) | ORAL | 0 refills | Status: DC | PRN

## 2022-10-10 MED ORDER — KETOROLAC TROMETHAMINE 30 MG/ML IJ SOLN
30.0000 mg | Freq: Once | INTRAMUSCULAR | Status: AC
  Administered 2022-10-10: 30 mg via INTRAMUSCULAR
  Filled 2022-10-10: qty 1

## 2022-10-10 MED ORDER — METHOCARBAMOL 500 MG PO TABS: 500.0000 mg | ORAL_TABLET | Freq: Four times a day (QID) | ORAL | 0 refills | Status: DC | PRN

## 2022-10-10 MED ORDER — METHOCARBAMOL 500 MG PO TABS
1000.0000 mg | ORAL_TABLET | Freq: Once | ORAL | Status: AC
Start: 2022-10-10 — End: 2022-10-10
  Administered 2022-10-10: 1000 mg via ORAL
  Filled 2022-10-10: qty 2

## 2022-10-10 MED ORDER — PREDNISONE 10 MG PO TABS: ORAL_TABLET | ORAL | 0 refills | Status: DC

## 2022-10-10 NOTE — ED Triage Notes (Signed)
Pt comes with c/o neck pain since Saturday. Pt states she went to hospital and was advised to come to ED for MRI. Apparently they can't do it there. Pt states pain is worse and she can't move it out of the position she currently has head tilted to left side.

## 2022-10-10 NOTE — Discharge Instructions (Addendum)
Call make an appoint with Dr. Izora Ribas who is on-call for neurosurgery.  His office address and phone number listed on your discharge papers.  A prescription for methocarbamol and prednisone was sent to the pharmacy.  The methocarbamol is a muscle relaxant and should be taken only if you plan on staying home.  Do not take this while driving or operating machinery as it could cause drowsiness and increase your risk for injury.  You may also use ice or heat to your neck as needed for discomfort.  Also a prescription for hydrocodone was sent if needed for moderate pain to be taken only when you are staying at home or at bedtime.  This in addition to the muscle relaxant will cause drowsiness.

## 2022-10-10 NOTE — ED Provider Notes (Signed)
[facility] Provider Note    Event Date/Time   First MD Initiated Contact with Patient 10/10/22 1014     (approximate)   History   Neck Injury   HPI  CATHE BILGER is a 52 y.o. female presents to the ED with continued neck pain.  Patient was seen in the emergency department in [facility] 10/07/2022 for acute neck pain without history of injury.  Patient states that she has not had any improvement with oxycodone and lidocaine patches.  Patient states that she still has difficulty moving her neck.  She denies any upper respiratory symptoms, fever, chills.  She does endorse some right upper extremity radiculopathy.  Has cervical surgery 2019 for degenerative disc disease and cervical disc arthroplasty of C5-C6.  Patient was told to follow-up with her orthopedist however Dr. Sharyne Richters has retired and she is unable to get an appointment with an orthopedist at [facility].     Physical Exam   Triage Vital Signs: ED Triage Vitals  Enc Vitals Group     BP 10/10/22 1009 125/88     Pulse Rate 10/10/22 1009 72     Resp 10/10/22 1009 16     Temp 10/10/22 1009 98.4 F (36.9 C)     Temp src --      SpO2 10/10/22 1009 99 %     Weight --      Height --      Head Circumference --      Peak Flow --      Pain Score 10/10/22 0947 8     Pain Loc --      Pain Edu? --      Excl. in [place]? --     Most recent vital signs: Vitals:   10/10/22 1009 10/10/22 1500  BP: 125/88 127/89  Pulse: 72 74  Resp: 16 16  Temp: 98.4 F (36.9 C) 98.4 F (36.9 C)  SpO2: 99% 100%     General: Awake, no distress.  CV:  Good peripheral perfusion.  Heart regular rate and rhythm. Resp:  Normal effort.  Lungs are clear. Abd:  No distention.  Other:  Diffuse tenderness on palpation of the cervical spine posteriorly.  There is more right paravertebral muscle tenderness than on the left.  Range of motion is restricted secondary to discomfort.  Patient is able move upper extremities without  any difficulty.  Grip strength is equal at 5/5 bilaterally.   ED Results / Procedures / Treatments   Labs (all labs ordered are listed, but only abnormal results are displayed) Labs Reviewed - No data to display    RADIOLOGY MRI cervical spine without contrast per radiologist shows signs right paracentral disc protrusion at C6-C7 with mild mass effect on the cord and possible encroachment on the right C7 nerve root.  Postsurgical changes.  Also a sebaceous cyst right lower neck area seen previously on other images.    PROCEDURES:  Critical Care performed:   Procedures   MEDICATIONS ORDERED IN ED: Medications  ketorolac (TORADOL) 30 MG/ML injection 30 mg (30 mg Intramuscular Given 10/10/22 1148)  methocarbamol (ROBAXIN) tablet 1,000 mg (1,000 mg Oral Given 10/10/22 1148)     IMPRESSION / MDM / ASSESSMENT AND PLAN / ED COURSE  I reviewed the triage vital signs and the nursing notes.   Differential diagnosis includes, but is not limited to, cervical pain, compression fracture, degenerative disc disease, herniated disc, right upper extremity radiculopathy.  52 year old female presents to the ED with complaint  of cervical pain with right upper extremity radiculopathy.  Patient had cervical surgery in 2019.  Patient was also seen in [facility] emergency department and was placed on Lidoderm and oxycodone, which does not seem to be helping.  Patient was given Toradol 30 mg IM and methocarbamol 1000 mg p.o. prior to getting an MRI.  The results of her MRI were reassuring patient was made aware.  Because her orthopedist has retired I suggested that she follow-up with a neurosurgeon for her symptoms and she agrees.  Patient was discharged with a prescription for methocarbamol, prednisone 6-day taper and hydrocodone if needed for moderate to severe pain.  She is encouraged to continue using the Lidoderm patches and call and make an appointment with Dr. Nelly Laurence office.      Patient's  presentation is most consistent with acute complicated illness / injury requiring diagnostic workup.  FINAL CLINICAL IMPRESSION(S) / ED DIAGNOSES   Final diagnoses:  Protrusion of cervical intervertebral disc  Radiculitis involving upper extremity     Rx / DC Orders   ED Discharge Orders          Ordered    predniSONE (DELTASONE) 10 MG tablet        10/10/22 1439    methocarbamol (ROBAXIN) 500 MG tablet  Every 6 hours PRN        10/10/22 1439    HYDROcodone-acetaminophen (NORCO/VICODIN) 5-325 MG tablet  Every 6 hours PRN        10/10/22 1445             Note:  This document was prepared using Dragon voice recognition software and may include unintentional dictation errors.   Johnn Hai, PA-C 10/10/22 1522    Blake Divine, MD 10/10/22 [phone]

## 2022-10-12 NOTE — Progress Notes (Signed)
Referring Physician:  Blake Divine, MD [place],  [place] [address]  Primary Physician:  Elza Rafter, FNP  History of Present Illness: 10/15/2022 [name] has a history of C5-C6 cervical arthroplasty by Dr. Sharyne Richters in 2019.    Seen in ED on 10/07/22 and again on 10/10/22 for neck and right arm pain that started on 10/03/22.   She has constant neck pain with constant right arm pain that stops at her elbow. She has numbness/tingling in her right hand (all her fingers). No weakness in right arm. No left sided pain. No known injury. Pain is sharp and aching.   Given oxycodone and lidoderm patches at her first visit. Then was given hydrocodone 5, robaxin, and prednisone at her visit on 10/10/22. Only mild improvement with medications.   She vapes with nicotine.   Conservative measures:  Physical therapy: none recent  Multimodal medical therapy including regular antiinflammatories: oxycodone, lidoderm patches, norco, prednisone.   Injections: No recent epidural steroid injections  Past Surgery:  C5-C6 cervical arthroplasty by Dr. Sharyne Richters in 2019.   Rebekah Miller has no symptoms of cervical myelopathy.  The symptoms are causing a significant impact on the patient's life.   Review of Systems:  A 10 point review of systems is negative, except for the pertinent positives and negatives detailed in the HPI.  Past Medical History: Past Medical History:  Diagnosis Date   Depression    Heart murmur     Past Surgical History: Past Surgical History:  Procedure Laterality Date   CARDIAC VALVE REPLACEMENT     CHOLECYSTECTOMY     SPINE SURGERY      Allergies: Allergies as of 10/15/2022   (No Known Allergies)    Medications: Outpatient Encounter Medications as of 10/15/2022  Medication Sig   escitalopram (LEXAPRO) 20 MG tablet TAKE 1 TABLET (20 MG TOTAL) BY MOUTH DAILY.   methocarbamol (ROBAXIN) 500 MG tablet Take 1 tablet (500 mg total) by mouth  every 6 (six) hours as needed for muscle spasms.   predniSONE (DELTASONE) 10 MG tablet Take 6 tablets  today, on day 2 take 5 tablets, day 3 take 4 tablets, day 4 take 3 tablets, day 5 take  2 tablets and 1 tablet the last day   [DISCONTINUED] esomeprazole (NEXIUM) 20 MG capsule Take 20 mg by mouth daily at 12 noon.   [DISCONTINUED] HYDROcodone-acetaminophen (NORCO/VICODIN) 5-325 MG tablet Take 1 tablet by mouth every 6 (six) hours as needed.   [DISCONTINUED] lidocaine (LIDODERM) 5 % Place 1 patch onto the skin daily. Remove & Discard patch within 12 hours or as directed by MD   [DISCONTINUED] oxyCODONE (ROXICODONE) 5 MG immediate release tablet Take 1 tablet (5 mg total) by mouth every 4 (four) hours as needed for severe pain.   No facility-administered encounter medications on file as of 10/15/2022.    Social History: Social History   Tobacco Use   Smoking status: Former    Types: Cigarettes    Quit date: 03/2022    Years since quitting: 0.5   Smokeless tobacco: Never   Tobacco comments:    Former cigarette user, current Optometrist Use: Every day   Substances: Nicotine  Substance Use Topics   Alcohol use: Yes    Comment: social   Drug use: No    Family Medical History: Family History  Problem Relation Age of Onset   Hyperlipidemia Mother    Heart disease Mother  Hypertension Mother    COPD Father     Physical Examination: Vitals:   10/15/22 0909  BP: (!) 175/91  Pulse: 74    General: Patient is well developed, well nourished, calm, collected, and in no apparent distress. Attention to examination is appropriate.  Respiratory: Patient is breathing without any difficulty.   NEUROLOGICAL:     Awake, alert, oriented to person, place, and time.  Speech is clear and fluent. Fund of knowledge is appropriate.   Cranial Nerves: Pupils equal round and reactive to light.  Facial tone is symmetric.  Facial sensation is symmetric.  She holds her head  to her left shoulder.   Cervical incision is well healed.   No posterior cervical tenderness.   No abnormal lesions on exposed skin.   Strength: Side Biceps Triceps Deltoid Interossei Grip Wrist Ext. Wrist Flex.  R 5 5 5 5 5 5 5   L 5 5 5 5 5 5 5    Side Iliopsoas Quads Hamstring PF DF EHL  R 5 5 5 5 5 5   L 5 5 5 5 5 5    Reflexes are 2+ and symmetric at the biceps, triceps, brachioradialis, patella and achilles.   Hoffman's is absent.  Clonus is not present.   Bilateral upper and lower extremity sensation is intact to light touch.     Gait is normal.     Medical Decision Making  Imaging: Cervical spine xrays dated 10/07/22:  FINDINGS: Surgical changes of discectomy and prosthetic disc placement at C5-C6. There is loss of the normal cervical lordosis. Additionally, new mild anterolisthesis of C2 on C3, C3 on C4 measuring between 2 and 3 mm. No evidence of fracture or malalignment. No significant new adjacent level changes. Prevertebral soft tissues are normal.   IMPRESSION: 1. Compared to prior imaging from 2018, interval changes of C5-C6 discectomy and prosthetic disc placement. 2. Loss of the normal cervical lordosis with mild anterolisthesis of C2 on C3 and C3 on C4. 3. No evidence of fracture, malalignment or significant adjacent level disease.     Electronically Signed   By: M.D.   On: 10/07/2022 09:31  Cervical MRI scan dated 10/10/22:  FINDINGS: Alignment: Straightening without focal angulation or listhesis.   Vertebrae: No acute or suspicious osseous findings. Postsurgical changes from discectomy and prosthetic disc placement at C5-6 with associated susceptibility artifact.   Cord: Possible mild chronic cord flattening and myelomalacia at C5-6. The cervical cord is otherwise normal in signal and caliber, without evidence edema or hemorrhage.   Posterior Fossa, vertebral arteries, paraspinal tissues: Visualized portions of the posterior  fossa appear unremarkable.Bilateral vertebral artery flow voids. No significant paraspinal findings. Previously demonstrated subcutaneous cystic structure laterally in the lower right neck is again noted, measuring up to 1.7 cm on image 13/8. This appears similar to previous CT and is likely a sebaceous cyst.   Disc levels:   No significant disc space findings at C2-3 or C3-4.   C4-5: Minimal disc bulging. No spinal stenosis or nerve root encroachment.   C5-6: Post discectomy and prosthetic disc placement. As above, possible mild chronic cord flattening and myelomalacia at this level. Allowing for the artifact, no evidence of significant spinal stenosis or foraminal narrowing.   C6-7: Moderate-sized right paracentral disc protrusion with mild mass effect on the cord and possible encroachment on the right C7 nerve root medially in the foramen. The left foramen appears patent.   C7-T1: Normal interspace.   IMPRESSION: 1. Moderate-sized right paracentral disc  protrusion at C6-7 with mild mass effect on the cord and possible encroachment on the right C7 nerve root medially in the foramen. This likely contributes to the patient's symptoms. 2. 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 No significant spinal stenosis or foraminal narrowing. 3. No other significant disc space findings. 4. Grossly stable sebaceous cyst laterally in the lower right neck compared with previous neck CT.     Electronically Signed   By: Richardean Sale M.D.   On: 10/10/2022 14:10  I have personally reviewed the images and agree with the above interpretation.  Assessment and Plan: Rebekah Miller is a pleasant 52 y.o. female with history of C5-C6 cervical arthroplasty by Dr. Sharyne Richters in 2019. Did great after the surgery until 10/03/22.   Now with 2 weeks of constant neck pain with constant right arm pain that stops at her elbow. She has numbness/tingling in her  right hand (all her fingers). No weakness in right arm.   She has known cervical disc arthroplasty at C5-C6 with moderate-sized right paracentral disc protrusion at C6-C7 with mild mass effect on the cord and possible encroachment on the right C7 nerve root medially in the foramen. Disc at C6-C7 is likely her pain generator.   Treatment options discussed with patient and following plan made:   - Fast track referral to PMR at [facility] for right C6-C7 TF ESI.  - Order for physical therapy for cervical spine sent to [facility] in [place]. - Continue on current medications including prn robaxin. She knows this can make her sleepy.  - Finish out prednisone from ED.  - Discussed trial of neurontin. She was on this previously  for RLS and did not like side effects. Will hold off.  - She is vaping with nicotine. Discussed she would likely need to stop this prior to any further surgery. She is working on quitting.  - Follow up with me in 4-6 weeks for recheck on day Dr. Izora Ribas is in clinic.   BP was elevated, it was 160/110. She does not have HTN, but states blood pressure is always high at doctors visits. She has no symptoms of chest pain, shortness of breath, blurry vision, or headaches.   She will recheck at home later today and call PCP if not improved. If she develops CP, SOB, blurry vision, or headaches, then she will go to ED.    I spent a total of 30 minutes in face-to-face and non-face-to-face activities related to this patient's care today including review of outside records, review of imaging, review of symptoms, physical exam, discussion of differential diagnosis, discussion of treatment options, and documentation.   Thank you for involving me in the care of this patient.   Geronimo Boot PA-C Dept. of Neurosurgery

## 2022-10-15 ENCOUNTER — Encounter: Payer: Self-pay | Admitting: Orthopedic Surgery

## 2022-10-15 ENCOUNTER — Ambulatory Visit (INDEPENDENT_AMBULATORY_CARE_PROVIDER_SITE_OTHER): Payer: BC Managed Care – PPO | Admitting: Orthopedic Surgery

## 2022-10-15 VITALS — BP 160/110 | HR 74 | Ht 67.0 in | Wt 187.0 lb

## 2022-10-15 DIAGNOSIS — Z9889 Other specified postprocedural states: Secondary | ICD-10-CM | POA: Diagnosis not present

## 2022-10-15 DIAGNOSIS — M5412 Radiculopathy, cervical region: Secondary | ICD-10-CM | POA: Diagnosis not present

## 2022-10-15 DIAGNOSIS — M5020 Other cervical disc displacement, unspecified cervical region: Secondary | ICD-10-CM | POA: Diagnosis not present

## 2022-10-15 NOTE — Patient Instructions (Addendum)
It was so nice to see you today, I am sorry that you are hurting so much.   You have a disc herniation at C6-C7, this is below your previous surgery at C5-C6. I think this is causing your neck and arm pain.   I sent physical therapy orders to [facility] in [place], they should call you to schedule.   Finish out the prednisone. Okay to take methocarbamol as needed to help with muscle spasms. Use only as needed and be careful, this can make you sleepy.   I want you to see physical medicine and rehab at the [facility] for a cervical (neck) injection. Dr. Sharlet Salina, Dr. Alba Destine, and their PA Loree Fee are great and will take good care of you. They should call you to schedule an appointment or you can call them at [phone].   Your blood pressure was elevated today, it was 160/110. I want you to recheck it at home and follow up with your PCP if it remains high. If you have any chest pain, shortness of breath, blurry vision, or headaches then you need to go to ED.   I will see you back in 4-6 weeks. Please do not hesitate to call if you have any questions or concerns. You can also message me in [place].   If you have not heard back about the PT or injection in the next week, please call the office so we can help you get them scheduled.   Geronimo Boot PA-C [phone]

## 2022-10-16 DIAGNOSIS — Z9889 Other specified postprocedural states: Secondary | ICD-10-CM

## 2022-10-16 DIAGNOSIS — M5020 Other cervical disc displacement, unspecified cervical region: Secondary | ICD-10-CM

## 2022-10-16 DIAGNOSIS — M5412 Radiculopathy, cervical region: Secondary | ICD-10-CM

## 2022-10-16 MED ORDER — TRAMADOL HCL 50 MG PO TABS: 50.0000 mg | ORAL_TABLET | Freq: Three times a day (TID) | ORAL | 0 refills | Status: DC | PRN

## 2022-10-16 NOTE — Telephone Encounter (Signed)
PMP reviewed and is appropriate.   Very limited prescription for ultram sent to pharmacy.

## 2022-10-16 NOTE — Addendum Note (Signed)
Addended byGeronimo Boot on: 10/16/2022 03:25 PM   Modules accepted: Orders

## 2022-10-16 NOTE — Telephone Encounter (Signed)
Please resend the referral to PMR that I did yesterday. Thanks.

## 2022-10-18 NOTE — Addendum Note (Signed)
Addended by: Berdine Addison on: 10/18/2022 12:26 PM   Modules accepted: Orders

## 2022-10-22 MED ORDER — TRAMADOL HCL 50 MG PO TABS: 50.0000 mg | ORAL_TABLET | Freq: Three times a day (TID) | ORAL | 0 refills | Status: DC | PRN

## 2022-10-22 NOTE — Addendum Note (Signed)
Addended byGeronimo Boot on: 10/22/2022 03:29 PM   Modules accepted: Orders

## 2022-10-24 ENCOUNTER — Ambulatory Visit
Admission: RE | Admit: 2022-10-24 | Discharge: 2022-10-24 | Disposition: A | Discharge status: Home / Self Care | Payer: BC Managed Care – PPO | Source: Ambulatory Visit | Attending: Orthopedic Surgery | Admitting: Orthopedic Surgery

## 2022-10-24 DIAGNOSIS — Z9889 Other specified postprocedural states: Secondary | ICD-10-CM

## 2022-10-24 DIAGNOSIS — M5020 Other cervical disc displacement, unspecified cervical region: Secondary | ICD-10-CM

## 2022-10-24 DIAGNOSIS — M5412 Radiculopathy, cervical region: Secondary | ICD-10-CM

## 2022-10-24 MED ORDER — TRIAMCINOLONE ACETONIDE 40 MG/ML IJ SUSP (RADIOLOGY)
60.0000 mg | Freq: Once | INTRAMUSCULAR | Status: AC
  Administered 2022-10-24: 60 mg via EPIDURAL

## 2022-10-24 MED ORDER — IOPAMIDOL (ISOVUE-M 300) INJECTION 61%
1.0000 mL | Freq: Once | INTRAMUSCULAR | Status: AC | PRN
  Administered 2022-10-24: 1 mL via EPIDURAL

## 2022-10-24 NOTE — Discharge Instructions (Signed)

## 2022-11-07 NOTE — Telephone Encounter (Signed)
I called [facility], the discharge note is not done yet but they will fax that and the other notes when done.

## 2022-11-07 NOTE — Telephone Encounter (Signed)
Please contact PT and see if she has been discharged and get that note.   If she's been discharged then cancel her appt with me on 2/27 and have her see Izora Ribas at his next available to discuss possible surgery.   Let me know so I can let her know. Thanks.

## 2022-11-09 MED ORDER — TRAMADOL HCL 50 MG PO TABS: 50.0000 mg | ORAL_TABLET | Freq: Three times a day (TID) | ORAL | 0 refills | Status: DC | PRN

## 2022-11-09 NOTE — Telephone Encounter (Signed)
PMP reviewed and appropriate. Small prescription of ultram sent to her pharmacy.

## 2022-11-09 NOTE — Addendum Note (Signed)
Addended byGeronimo Boot on: 11/09/2022 04:02 PM   Modules accepted: Orders

## 2022-11-09 NOTE — Telephone Encounter (Signed)
Please let me know when get get these notes. If we don't have them by Monday, can you call again?

## 2022-11-20 ENCOUNTER — Ambulatory Visit: Payer: BC Managed Care – PPO | Admitting: Orthopedic Surgery

## 2022-11-21 NOTE — H&P (View-Only) (Signed)
Referring Physician:  No referring provider defined for this encounter.  Primary Physician:  Elza Rafter, FNP  History of Present Illness: 11/22/2022 Rebekah Miller is here today with a chief complaint of neck and right arm pain.  Her pain goes to the center of her hand and sometimes her whole hand.  Her right hand is numb.  She has severe pain into her right shoulder blade.  She has discomfort in her right forearm as well.  Her pain is worse by moving, sitting certain ways, and standing.  Bowel/Bladder Dysfunction: none  Conservative measures:  Physical therapy: has participated at [facility] Multimodal medical therapy including regular antiinflammatories: oxycodone, lidoderm patches, hydrocodone, robaxin, prednisone, tramadol Injections: has received epidural steroid injections 10/24/22: right C7 ESI - 10% relief the day of  Past Surgery:  C5-C6 cervical arthroplasty by Dr. Sharyne Richters in 2019    Rebekah Miller has no symptoms of cervical myelopathy.  The symptoms are causing a significant impact on the patient's life.   I have utilized the care everywhere function in epic to review the outside records available from external health systems.  Progress Note from Geronimo Boot, [place] on 10/15/22:   History of Present Illness: 10/15/2022 Rebekah Miller has a history of C5-C6 cervical arthroplasty by Dr. Sharyne Richters in 2019.     Seen in ED on 10/07/22 and again on 10/10/22 for neck and right arm pain that started on 10/03/22.    She has constant neck pain with constant right arm pain that stops at her elbow. She has numbness/tingling in her right hand (all her fingers). No weakness in right arm. No left sided pain. No known injury. Pain is sharp and aching.    Given oxycodone and lidoderm patches at her first visit. Then was given hydrocodone 5, robaxin, and prednisone at her visit on 10/10/22. Only mild improvement with medications.    She vapes with nicotine.  Review of Systems:   A 10 point review of systems is negative, except for the pertinent positives and negatives detailed in the HPI.  Past Medical History: Past Medical History:  Diagnosis Date   Anxiety    Depression    Heart murmur     Past Surgical History: Past Surgical History:  Procedure Laterality Date   CARDIAC VALVE REPLACEMENT     CHOLECYSTECTOMY     SPINE SURGERY      Allergies: Allergies as of 11/22/2022   (No Known Allergies)    Medications: Current Meds  Medication Sig   busPIRone (BUSPAR) 5 MG tablet Take 5 mg by mouth as needed.    Social History: Social History   Tobacco Use   Smoking status: Former    Types: Cigarettes    Quit date: 03/2022    Years since quitting: 0.6   Smokeless tobacco: Never  Vaping Use   Vaping Use: Former   Quit date: 10/18/2022   Substances: Nicotine  Substance Use Topics   Alcohol use: Yes    Comment: social   Drug use: No    Family Medical History: Family History  Problem Relation Age of Onset   Hyperlipidemia Mother    Heart disease Mother    Hypertension Mother    COPD Father     Physical Examination: Vitals:   11/22/22 1402  BP: (!) 158/89  Pulse: 87    General: Patient is well developed, well nourished, calm, collected, and in moderate apparent distress. Attention to examination is appropriate.  Neck:  Supple.  Limited rotation to the right with pain   Respiratory: Patient is breathing without any difficulty.   NEUROLOGICAL:     Awake, alert, oriented to person, place, and time.  Speech is clear and fluent.   Cranial Nerves: Pupils equal round and reactive to light.  Facial tone is symmetric.  Facial sensation is symmetric. Shoulder shrug is symmetric. Tongue protrusion is midline.  There is no pronator drift.  ROM of spine: full.    Strength: Side Biceps Triceps Deltoid Interossei Grip Wrist Ext. Wrist Flex.  R 5 4+ '5 5 5 5 5  '$ L '5 5 5 5 5 5 5   '$ Side Iliopsoas Quads Hamstring PF DF EHL  R '5 5 5 5 5 5  '$ L  '5 5 5 5 5 5   '$ Reflexes are 1+ and symmetric at the biceps, triceps, brachioradialis, patella and achilles.   Hoffman's is absent.   Bilateral upper and lower extremity sensation is intact to light touch.    No evidence of dysmetria noted.  Gait is normal.     Medical Decision Making  Imaging: MRI C spine 10/10/2022 IMPRESSION: 1. Moderate-sized right paracentral disc protrusion at C6-7 with mild mass effect on the cord and possible encroachment on the right C7 nerve root medially in the foramen. This likely contributes to the patient's symptoms. 2. 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 No significant spinal stenosis or foraminal narrowing. 3. No other significant disc space findings. 4. Grossly stable sebaceous cyst laterally in the lower right neck compared with previous neck CT.     Electronically Signed   By: Richardean Sale M.D.   On: 10/10/2022 14:10  I have personally reviewed the images and agree with the above interpretation.  Assessment and Plan: Rebekah Miller is a pleasant 52 y.o. female with right C7 radiculopathy.  She has tried physical therapy and injections without improvement.  At this point, she is exhausted conservative management.  No further conservative management is indicated.  I recommended C6-7 cervical disc arthroplasty.  She has a maintain distance height and no evidence of abnormal biomechanics on her x-rays.  She has no kyphosis.  I will send her to ENT for evaluation of her vocal cords.  I discussed the planned procedure at length with the patient, including the risks, benefits, alternatives, and indications. The risks discussed include but are not limited to bleeding, infection, need for reoperation, spinal fluid leak, stroke, vision loss, anesthetic complication, coma, paralysis, and even death. We also discussed the possibility of post-operative dysphagia, vocal cord paralysis, and the risk of  adjacent segment disease in the future. I also described in detail that improvement was not guaranteed.  The patient expressed understanding of these risks, and asked that we proceed with surgery. I described the surgery in layman's terms, and gave ample opportunity for questions, which were answered to the best of my ability.  I spent a total of 30 minutes in this patient's care today. This time was spent reviewing pertinent records including imaging studies, obtaining and confirming history, performing a directed evaluation, formulating and discussing my recommendations, and documenting the visit within the medical record.      Thank you for involving me in the care of this patient.      Torsha Lemus K. Izora Ribas MD, [facility] Neurosurgery

## 2022-11-21 NOTE — Progress Notes (Unsigned)
Referring Physician:  No referring provider defined for this encounter.  Primary Physician:  Elza Rafter, FNP  History of Present Illness: 11/22/2022 Rebekah Miller is here today with a chief complaint of neck and right arm pain.  Her pain goes to the center of her hand and sometimes her whole hand.  Her right hand is numb.  She has severe pain into her right shoulder blade.  She has discomfort in her right forearm as well.  Her pain is worse by moving, sitting certain ways, and standing.  Bowel/Bladder Dysfunction: none  Conservative measures:  Physical therapy: has participated at [facility] medical therapy including regular antiinflammatories: oxycodone, lidoderm patches, hydrocodone, robaxin, prednisone, tramadol Injections: has received epidural steroid injections 10/24/22: right C7 ESI - 10% relief the day of  Past Surgery:  C5-C6 cervical arthroplasty by Dr. Sharyne Richters in 2019    Rebekah Miller has no symptoms of cervical myelopathy.  The symptoms are causing a significant impact on the patient's life.   I have utilized the care everywhere function in epic to review the outside records available from external health systems.  Progress Note from Geronimo Boot, [place] on 10/15/22:   History of Present Illness: 10/15/2022 Rebekah Miller has a history of C5-C6 cervical arthroplasty by Dr. Sharyne Richters in 2019.     Seen in ED on 10/07/22 and again on 10/10/22 for neck and right arm pain that started on 10/03/22.    She has constant neck pain with constant right arm pain that stops at her elbow. She has numbness/tingling in her right hand (all her fingers). No weakness in right arm. No left sided pain. No known injury. Pain is sharp and aching.    Given oxycodone and lidoderm patches at her first visit. Then was given hydrocodone 5, robaxin, and prednisone at her visit on 10/10/22. Only mild improvement with medications.    She vapes with nicotine.  Review of Systems:   A 10 point review of systems is negative, except for the pertinent positives and negatives detailed in the HPI.  Past Medical History: Past Medical History:  Diagnosis Date   Anxiety    Depression    Heart murmur     Past Surgical History: Past Surgical History:  Procedure Laterality Date   CARDIAC VALVE REPLACEMENT     CHOLECYSTECTOMY     SPINE SURGERY      Allergies: Allergies as of 11/22/2022   (No Known Allergies)    Medications: Current Meds  Medication Sig   busPIRone (BUSPAR) 5 MG tablet Take 5 mg by mouth as needed.    Social History: Social History   Tobacco Use   Smoking status: Former    Types: Cigarettes    Quit date: 03/2022    Years since quitting: 0.6   Smokeless tobacco: Never  Vaping Use   Vaping Use: Former   Quit date: 10/18/2022   Substances: Nicotine  Substance Use Topics   Alcohol use: Yes    Comment: social   Drug use: No    Family Medical History: Family History  Problem Relation Age of Onset   Hyperlipidemia Mother    Heart disease Mother    Hypertension Mother    COPD Father     Physical Examination: Vitals:   11/22/22 1402  BP: (!) 158/89  Pulse: 87    General: Patient is well developed, well nourished, calm, collected, and in moderate apparent distress. Attention to examination is appropriate.  Neck:  Supple.  Limited rotation to the right with pain   Respiratory: Patient is breathing without any difficulty.   NEUROLOGICAL:     Awake, alert, oriented to person, place, and time.  Speech is clear and fluent.   Cranial Nerves: Pupils equal round and reactive to light.  Facial tone is symmetric.  Facial sensation is symmetric. Shoulder shrug is symmetric. Tongue protrusion is midline.  There is no pronator drift.  ROM of spine: full.    Strength: Side Biceps Triceps Deltoid Interossei Grip Wrist Ext. Wrist Flex.  R 5 4+ '5 5 5 5 5  '$ L '5 5 5 5 5 5 5   '$ Side Iliopsoas Quads Hamstring PF DF EHL  R '5 5 5 5 5 5  '$ L  '5 5 5 5 5 5   '$ Reflexes are 1+ and symmetric at the biceps, triceps, brachioradialis, patella and achilles.   Hoffman's is absent.   Bilateral upper and lower extremity sensation is intact to light touch.    No evidence of dysmetria noted.  Gait is normal.     Medical Decision Making  Imaging: MRI C spine 10/10/2022 IMPRESSION: 1. Moderate-sized right paracentral disc protrusion at C6-7 with mild mass effect on the cord and possible encroachment on the right C7 nerve root medially in the foramen. This likely contributes to the patient's symptoms. 2. 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 No significant spinal stenosis or foraminal narrowing. 3. No other significant disc space findings. 4. Grossly stable sebaceous cyst laterally in the lower right neck compared with previous neck CT.     Electronically Signed   By: Richardean Sale M.D.   On: 10/10/2022 14:10  I have personally reviewed the images and agree with the above interpretation.  Assessment and Plan: Ms. Bertz is a pleasant 52 y.o. female with right C7 radiculopathy.  She has tried physical therapy and injections without improvement.  At this point, she is exhausted conservative management.  No further conservative management is indicated.  I recommended C6-7 cervical disc arthroplasty.  She has a maintain distance height and no evidence of abnormal biomechanics on her x-rays.  She has no kyphosis.  I will send her to ENT for evaluation of her vocal cords.  I discussed the planned procedure at length with the patient, including the risks, benefits, alternatives, and indications. The risks discussed include but are not limited to bleeding, infection, need for reoperation, spinal fluid leak, stroke, vision loss, anesthetic complication, coma, paralysis, and even death. We also discussed the possibility of post-operative dysphagia, vocal cord paralysis, and the risk of  adjacent segment disease in the future. I also described in detail that improvement was not guaranteed.  The patient expressed understanding of these risks, and asked that we proceed with surgery. I described the surgery in layman's terms, and gave ample opportunity for questions, which were answered to the best of my ability.  I spent a total of 30 minutes in this patient's care today. This time was spent reviewing pertinent records including imaging studies, obtaining and confirming history, performing a directed evaluation, formulating and discussing my recommendations, and documenting the visit within the medical record.      Thank you for involving me in the care of this patient.      Layna Roeper K. Izora Ribas MD, [facility] Neurosurgery

## 2022-11-22 ENCOUNTER — Other Ambulatory Visit: Payer: Self-pay

## 2022-11-22 ENCOUNTER — Ambulatory Visit (INDEPENDENT_AMBULATORY_CARE_PROVIDER_SITE_OTHER): Payer: BC Managed Care – PPO | Admitting: Neurosurgery

## 2022-11-22 ENCOUNTER — Encounter: Payer: Self-pay | Admitting: Neurosurgery

## 2022-11-22 VITALS — BP 158/89 | HR 87 | Ht 67.0 in | Wt 187.2 lb

## 2022-11-22 DIAGNOSIS — Z01818 Encounter for other preprocedural examination: Secondary | ICD-10-CM

## 2022-11-22 DIAGNOSIS — Z9889 Other specified postprocedural states: Secondary | ICD-10-CM | POA: Diagnosis not present

## 2022-11-22 DIAGNOSIS — M5412 Radiculopathy, cervical region: Secondary | ICD-10-CM | POA: Diagnosis not present

## 2022-11-22 NOTE — Patient Instructions (Signed)
Please see below for information in regards to your upcoming surgery:  **we are planning to move our office location mid-March. Please check with [place] to see if we have moved to our new location prior to coming to your post op appointments after surgery. Our phone number will remain the same [phone]). Our new address will be: [facility] [address] [place], [place] [address]  Planned surgery: C6-7 arthroplasty   Surgery date: 12/10/22 - you will find out your arrival time the business day before your surgery.   Pre-op appointment at [facility]: we will call you with a date/time for this. Pre-admit testing is located on the first floor of the [facility], [place], [address]. Please bring all prescriptions in the original prescription bottles to your appointment, even if you have reviewed medications by phone with a pharmacy representative. Pre-op labs may be done at your pre-op appointment. You are not required to fast for these labs. Should you need to change your pre-op appointment, please call Pre-admit testing at [phone].    Surgical clearance: we will send a referral to [facility] for clearance   Because you are having an arthroplasty: for appointments after your 2 week follow-up: please arrive at the [facility] outpatient imaging center ([place], [place]) or [facility] one hour prior to your appointment for x-rays. This applies to every appointment after your 2 week follow-up. Failure to do so may result in your appointment being rescheduled.   If you have FMLA/disability paperwork, please drop it off or fax it to [phone], attention [place].   We can be reached by phone or mychart 8am-4pm, Monday-Friday. If you have any questions/concerns before or after surgery, you can reach [place] at [phone], or you can send a mychart message. If you have  a concern after hours that cannot wait until normal business hours, you can call [phone] and ask to page the neurosurgeon on call for [place].     Appointments/FMLA & disability paperwork: [place]  Nurse: Ophelia Shoulder  Medical assistants: Lum Keas Physician Assistant's: [place] Surgeon: Meade Maw, MD

## 2022-11-29 ENCOUNTER — Encounter
Admission: RE | Admit: 2022-11-29 | Discharge: 2022-11-29 | Disposition: A | Discharge status: Home / Self Care | Payer: BC Managed Care – PPO | Source: Ambulatory Visit | Attending: Neurosurgery | Admitting: Neurosurgery

## 2022-11-29 VITALS — BP 138/72 | HR 74 | Resp 14 | Ht 67.0 in | Wt 190.5 lb

## 2022-11-29 DIAGNOSIS — R011 Cardiac murmur, unspecified: Secondary | ICD-10-CM | POA: Insufficient documentation

## 2022-11-29 DIAGNOSIS — Q210 Ventricular septal defect: Secondary | ICD-10-CM | POA: Diagnosis not present

## 2022-11-29 DIAGNOSIS — Z01818 Encounter for other preprocedural examination: Secondary | ICD-10-CM | POA: Diagnosis not present

## 2022-11-29 DIAGNOSIS — Z01812 Encounter for preprocedural laboratory examination: Secondary | ICD-10-CM

## 2022-11-29 DIAGNOSIS — Z01810 Encounter for preprocedural cardiovascular examination: Secondary | ICD-10-CM

## 2022-11-29 HISTORY — DX: Hirsutism: L68.0

## 2022-11-29 HISTORY — DX: Vitamin D deficiency, unspecified: E55.9

## 2022-11-29 HISTORY — DX: Unspecified convulsions: R56.9

## 2022-11-29 HISTORY — DX: Ventricular septal defect: Q21.0

## 2022-11-29 HISTORY — DX: Personal history of other diseases of the nervous system and sense organs: Z86.69

## 2022-11-29 HISTORY — DX: Gastro-esophageal reflux disease without esophagitis: K21.9

## 2022-11-29 HISTORY — DX: Pure hyperglyceridemia: E78.1

## 2022-11-29 HISTORY — DX: Dyspnea, unspecified: R06.00

## 2022-11-29 HISTORY — DX: Mixed incontinence: N39.46

## 2022-11-29 HISTORY — DX: Chronic fatigue, unspecified: R53.82

## 2022-11-29 HISTORY — DX: Other complications of anesthesia, initial encounter: T88.59XA

## 2022-11-29 LAB — TYPE AND SCREEN
ABO/RH(D): O POS
Antibody Screen: NEGATIVE

## 2022-11-29 LAB — SURGICAL PCR SCREEN
MRSA, PCR: NEGATIVE
Staphylococcus aureus: NEGATIVE

## 2022-11-29 LAB — BASIC METABOLIC PANEL
Anion gap: 9 (ref 5–15)
BUN: 13 mg/dL (ref 6–20)
CO2: 21 mmol/L — ABNORMAL LOW (ref 22–32)
Calcium: 9.5 mg/dL (ref 8.9–10.3)
Chloride: 108 mmol/L (ref 98–111)
Creatinine, Ser: 0.69 mg/dL (ref 0.44–1.00)
GFR, Estimated: >60 mL/min (ref >60)
Glucose, Bld: 91 mg/dL (ref 70–99)
Potassium: 4.2 mmol/L (ref 3.5–5.1)
Sodium: 138 mmol/L (ref 135–145)

## 2022-11-29 LAB — CBC
HCT: 40.9 % (ref 36.0–46.0)
Hemoglobin: 13.2 g/dL (ref 12.0–15.0)
MCH: 28.5 pg (ref 26.0–34.0)
MCHC: 32.3 g/dL (ref 30.0–36.0)
MCV: 88.3 fL (ref 80.0–100.0)
Platelets: 418 10*3/uL — ABNORMAL HIGH (ref 150–400)
RBC: 4.63 MIL/uL (ref 3.87–5.11)
RDW: 14.5 % (ref 11.5–15.5)
WBC: 10 10*3/uL (ref 4.0–10.5)
nRBC: 0 % (ref 0.0–0.2)

## 2022-11-29 NOTE — Patient Instructions (Addendum)
Your procedure is scheduled on:12-10-22 Monday Report to the Registration Desk on the 1st floor of the [facility].Then proceed to the 2nd floor Surgery Desk To find out your arrival time, please call [phone] between 1PM - 3PM on:12-07-22 Friday If your arrival time is 6:00 am, do not arrive before that time as the [facility] entrance doors do not open until 6:00 am.  REMEMBER: Instructions that are not followed completely may result in serious medical risk, up to and including death; or upon the discretion of your surgeon and anesthesiologist your surgery may need to be rescheduled.  Do not eat food after midnight the night before surgery.  No gum chewing or hard candies.  You may however, drink CLEAR liquids up to 2 hours before you are scheduled to arrive for your surgery. Do not drink anything within 2 hours of your scheduled arrival time.  Clear liquids include: - water  - apple juice without pulp - gatorade (not RED colors) - black coffee or tea (Do NOT add milk or creamers to the coffee or tea) Do NOT drink anything that is not on this list.  One week prior to surgery:Last dose will be on 12-02-22 Stop Anti-inflammatories (NSAIDS) such as Advil, Aleve, Ibuprofen, Motrin, Naproxen, Naprosyn and Aspirin based products such as Excedrin, Goody's Powder, BC Powder, Pepto Bismol.You may however, continue to take Tylenol/Tramadol if needed for pain up until the day of surgery.  Stop ANY OVER THE COUNTER supplements/vitamins 7 days prior to surgery  Do NOT take any medication the day of surgery  No Alcohol for 24 hours before or after surgery.  No Smoking including e-cigarettes for 24 hours before surgery.  No chewable tobacco products for at least 6 hours before surgery.  No nicotine patches on the day of surgery.  Do not use any "recreational" drugs for at least a week (preferably 2 weeks) before your surgery.  Please be advised that the combination of cocaine and anesthesia  may have negative outcomes, up to and including death. If you test positive for cocaine, your surgery will be cancelled.  On the morning of surgery brush your teeth with toothpaste and water, you may rinse your mouth with mouthwash if you wish. Do not swallow any toothpaste or mouthwash.  Use CHG Soap as directed on instruction sheet.  Do not wear jewelry, make-up, hairpins, clips or nail polish.  Do not wear lotions, powders, or perfumes.   Do not shave body hair from the neck down 48 hours before surgery.  Contact lenses, hearing aids and dentures may not be worn into surgery.  Do not bring valuables to the hospital. [facility] is not responsible for any missing/lost belongings or valuables.   Notify your doctor if there is any change in your medical condition (cold, fever, infection).  Wear comfortable clothing (specific to your surgery type) to the hospital.  After surgery, you can help prevent lung complications by doing breathing exercises.  Take deep breaths and cough every 1-2 hours. Your doctor may order a device called an Incentive Spirometer to help you take deep breaths. When coughing or sneezing, hold a pillow firmly against your incision with both hands. This is called "splinting." Doing this helps protect your incision. It also decreases belly discomfort.  If you are being admitted to the hospital overnight, leave your suitcase in the car. After surgery it may be brought to your room.  In case of increased patient census, it may be necessary for you, the patient,  to continue your postoperative care in the Same Day Surgery department.  If you are being discharged the day of surgery, you will not be allowed to drive home. You will need a responsible individual to drive you home and stay with you for 24 hours after surgery.   If you are taking public transportation, you will need to have a responsible individual with you.  Please call the [facility] Dept.  at [phone] if you have any questions about these instructions.  Surgery Visitation Policy:  Patients undergoing a surgery or procedure may have two family members or support persons with them as long as the person is not COVID-19 positive or experiencing its symptoms.   Due to an increase in RSV and influenza rates and associated hospitalizations, children ages 99 and under will not be able to visit patients in [facility]. Masks continue to be strongly recommended.     Preparing for Surgery with CHLORHEXIDINE GLUCONATE (CHG) Soap  Chlorhexidine Gluconate (CHG) Soap  o An antiseptic cleaner that kills germs and bonds with the skin to continue killing germs even after washing  o Used for showering the night before surgery and morning of surgery  Before surgery, you can play an important role by reducing the number of germs on your skin.  CHG (Chlorhexidine gluconate) soap is an antiseptic cleanser which kills germs and bonds with the skin to continue killing germs even after washing.  Please do not use if you have an allergy to CHG or antibacterial soaps. If your skin becomes reddened/irritated stop using the CHG.  1. Shower the NIGHT BEFORE SURGERY and the MORNING OF SURGERY with CHG soap.  2. If you choose to wash your hair, wash your hair first as usual with your normal shampoo.  3. After shampooing, rinse your hair and body thoroughly to remove the shampoo.  4. Use CHG as you would any other liquid soap. You can apply CHG directly to the skin and wash gently with a scrungie or a clean washcloth.  5. Apply the CHG soap to your body only from the neck down. Do not use on open wounds or open sores. Avoid contact with your eyes, ears, mouth, and genitals (private parts). Wash face and genitals (private parts) with your normal soap.  6. Wash thoroughly, paying special attention to the area where your surgery will be performed.  7. Thoroughly rinse your body with  warm water.  8. Do not shower/wash with your normal soap after using and rinsing off the CHG soap.  9. Pat yourself dry with a clean towel.  10. Wear clean pajamas to bed the night before surgery.  12. Place clean sheets on your bed the night of your first shower and do not sleep with pets.  13. Shower again with the CHG soap on the day of surgery prior to arriving at the hospital.  14. Do not apply any deodorants/lotions/powders.  15. Please wear clean clothes to the hospital.

## 2022-12-09 MED ORDER — FAMOTIDINE 20 MG PO TABS: 20.0000 mg | ORAL_TABLET | Freq: Once | ORAL | Status: AC

## 2022-12-09 MED ORDER — CEFAZOLIN SODIUM-DEXTROSE 2-4 GM/100ML-% IV SOLN
2.0000 g | INTRAVENOUS | Status: AC
  Administered 2022-12-10: 2 g via INTRAVENOUS

## 2022-12-09 MED ORDER — CHLORHEXIDINE GLUCONATE 0.12 % MT SOLN: 15.0000 mL | Freq: Once | OROMUCOSAL | Status: AC

## 2022-12-09 MED ORDER — CEFAZOLIN IN SODIUM CHLORIDE 2-0.9 GM/100ML-% IV SOLN
2.0000 g | Freq: Once | INTRAVENOUS | Status: DC
  Filled 2022-12-09: qty 100

## 2022-12-09 MED ORDER — ORAL CARE MOUTH RINSE: 15.0000 mL | Freq: Once | OROMUCOSAL | Status: AC

## 2022-12-09 MED ORDER — LACTATED RINGERS IV SOLN: INTRAVENOUS | Status: DC

## 2022-12-10 ENCOUNTER — Encounter
Admission: RE | Disposition: A | Discharge status: Home / Self Care | Payer: Self-pay | Source: Home / Self Care | Attending: Neurosurgery

## 2022-12-10 ENCOUNTER — Encounter: Payer: Self-pay | Admitting: Neurosurgery

## 2022-12-10 ENCOUNTER — Ambulatory Visit: Payer: BC Managed Care – PPO

## 2022-12-10 ENCOUNTER — Ambulatory Visit: Payer: BC Managed Care – PPO | Admitting: Urgent Care

## 2022-12-10 ENCOUNTER — Other Ambulatory Visit: Payer: Self-pay

## 2022-12-10 ENCOUNTER — Ambulatory Visit
Admission: RE | Admit: 2022-12-10 | Discharge: 2022-12-10 | Disposition: A | Discharge status: Home / Self Care | Payer: BC Managed Care – PPO | Attending: Neurosurgery | Admitting: Neurosurgery

## 2022-12-10 DIAGNOSIS — Z01818 Encounter for other preprocedural examination: Secondary | ICD-10-CM

## 2022-12-10 DIAGNOSIS — Z87891 Personal history of nicotine dependence: Secondary | ICD-10-CM | POA: Insufficient documentation

## 2022-12-10 DIAGNOSIS — M5412 Radiculopathy, cervical region: Secondary | ICD-10-CM

## 2022-12-10 DIAGNOSIS — M50123 Cervical disc disorder at C6-C7 level with radiculopathy: Secondary | ICD-10-CM | POA: Diagnosis not present

## 2022-12-10 DIAGNOSIS — F419 Anxiety disorder, unspecified: Secondary | ICD-10-CM | POA: Diagnosis not present

## 2022-12-10 HISTORY — PX: CERVICAL DISC ARTHROPLASTY: SHX587

## 2022-12-10 LAB — POCT PREGNANCY, URINE: Preg Test, Ur: NEGATIVE

## 2022-12-10 LAB — ABO/RH: ABO/RH(D): O POS

## 2022-12-10 SURGERY — CERVICAL ANTERIOR DISC ARTHROPLASTY
Anesthesia: General | Site: Neck

## 2022-12-10 MED ORDER — LIDOCAINE HCL (PF) 2 % IJ SOLN
INTRAMUSCULAR | Status: AC
  Filled 2022-12-10: qty 5

## 2022-12-10 MED ORDER — OXYCODONE HCL 5 MG PO TABS
ORAL_TABLET | ORAL | Status: AC
  Filled 2022-12-10: qty 1

## 2022-12-10 MED ORDER — EPHEDRINE SULFATE (PRESSORS) 50 MG/ML IJ SOLN
INTRAMUSCULAR | Status: DC | PRN
  Administered 2022-12-10 (×3): 5 mg via INTRAVENOUS

## 2022-12-10 MED ORDER — METHOCARBAMOL 500 MG PO TABS: 500.0000 mg | ORAL_TABLET | Freq: Four times a day (QID) | ORAL | 0 refills | Status: AC | PRN

## 2022-12-10 MED ORDER — NAPROXEN 500 MG PO TABS: 500.0000 mg | ORAL_TABLET | Freq: Two times a day (BID) | ORAL | 0 refills | Status: DC

## 2022-12-10 MED ORDER — ONDANSETRON HCL 4 MG/2ML IJ SOLN
INTRAMUSCULAR | Status: AC
  Filled 2022-12-10: qty 2

## 2022-12-10 MED ORDER — OXYCODONE-ACETAMINOPHEN 5-325 MG PO TABS: 1.0000 | ORAL_TABLET | ORAL | 0 refills | Status: AC | PRN

## 2022-12-10 MED ORDER — FENTANYL CITRATE (PF) 100 MCG/2ML IJ SOLN
INTRAMUSCULAR | Status: AC
  Filled 2022-12-10: qty 2

## 2022-12-10 MED ORDER — PHENYLEPHRINE HCL (PRESSORS) 10 MG/ML IV SOLN
INTRAVENOUS | Status: AC
  Filled 2022-12-10: qty 1

## 2022-12-10 MED ORDER — KETAMINE HCL 50 MG/5ML IJ SOSY
PREFILLED_SYRINGE | INTRAMUSCULAR | Status: AC
  Filled 2022-12-10: qty 5

## 2022-12-10 MED ORDER — BUPIVACAINE HCL (PF) 0.5 % IJ SOLN
INTRAMUSCULAR | Status: AC
  Filled 2022-12-10: qty 30

## 2022-12-10 MED ORDER — SUCCINYLCHOLINE CHLORIDE 200 MG/10ML IV SOSY
PREFILLED_SYRINGE | INTRAVENOUS | Status: AC
  Filled 2022-12-10: qty 10

## 2022-12-10 MED ORDER — REMIFENTANIL HCL 1 MG IV SOLR
INTRAVENOUS | Status: AC
  Filled 2022-12-10: qty 1000

## 2022-12-10 MED ORDER — DEXAMETHASONE SODIUM PHOSPHATE 10 MG/ML IJ SOLN
INTRAMUSCULAR | Status: AC
  Filled 2022-12-10: qty 1

## 2022-12-10 MED ORDER — ONDANSETRON HCL 4 MG/2ML IJ SOLN: 4.0000 mg | Freq: Once | INTRAMUSCULAR | Status: DC | PRN

## 2022-12-10 MED ORDER — FAMOTIDINE 20 MG PO TABS
ORAL_TABLET | ORAL | Status: AC
  Administered 2022-12-10: 20 mg via ORAL
  Filled 2022-12-10: qty 1

## 2022-12-10 MED ORDER — ACETAMINOPHEN 10 MG/ML IV SOLN: 1000.0000 mg | Freq: Once | INTRAVENOUS | Status: DC | PRN

## 2022-12-10 MED ORDER — SURGIFLO WITH THROMBIN (HEMOSTATIC MATRIX KIT) OPTIME
TOPICAL | Status: DC | PRN
  Administered 2022-12-10: 1 via TOPICAL

## 2022-12-10 MED ORDER — CEFAZOLIN SODIUM-DEXTROSE 2-4 GM/100ML-% IV SOLN
INTRAVENOUS | Status: AC
  Filled 2022-12-10: qty 100

## 2022-12-10 MED ORDER — MIDAZOLAM HCL 2 MG/2ML IJ SOLN
INTRAMUSCULAR | Status: AC
  Filled 2022-12-10: qty 2

## 2022-12-10 MED ORDER — 0.9 % SODIUM CHLORIDE (POUR BTL) OPTIME
TOPICAL | Status: DC | PRN
  Administered 2022-12-10: 1000 mL

## 2022-12-10 MED ORDER — DEXAMETHASONE SODIUM PHOSPHATE 10 MG/ML IJ SOLN
INTRAMUSCULAR | Status: DC | PRN
  Administered 2022-12-10: 10 mg via INTRAVENOUS

## 2022-12-10 MED ORDER — LIDOCAINE HCL (CARDIAC) PF 100 MG/5ML IV SOSY
PREFILLED_SYRINGE | INTRAVENOUS | Status: DC | PRN
  Administered 2022-12-10: 100 mg via INTRAVENOUS

## 2022-12-10 MED ORDER — PHENYLEPHRINE HCL-NACL 20-0.9 MG/250ML-% IV SOLN
INTRAVENOUS | Status: DC | PRN
  Administered 2022-12-10: 65 ug/min via INTRAVENOUS

## 2022-12-10 MED ORDER — PROPOFOL 10 MG/ML IV BOLUS
INTRAVENOUS | Status: AC
  Filled 2022-12-10: qty 20

## 2022-12-10 MED ORDER — KETOROLAC TROMETHAMINE 30 MG/ML IJ SOLN
INTRAMUSCULAR | Status: DC | PRN
  Administered 2022-12-10: 30 mg via INTRAVENOUS

## 2022-12-10 MED ORDER — OXYCODONE HCL 5 MG/5ML PO SOLN: 5.0000 mg | Freq: Once | ORAL | Status: AC | PRN

## 2022-12-10 MED ORDER — EPINEPHRINE PF 1 MG/ML IJ SOLN
INTRAMUSCULAR | Status: AC
  Filled 2022-12-10: qty 1

## 2022-12-10 MED ORDER — PROPOFOL 1000 MG/100ML IV EMUL
INTRAVENOUS | Status: AC
  Filled 2022-12-10: qty 100

## 2022-12-10 MED ORDER — FENTANYL CITRATE (PF) 100 MCG/2ML IJ SOLN: 25.0000 ug | INTRAMUSCULAR | Status: DC | PRN

## 2022-12-10 MED ORDER — OXYCODONE HCL 5 MG PO TABS
5.0000 mg | ORAL_TABLET | Freq: Once | ORAL | Status: AC | PRN
  Administered 2022-12-10: 5 mg via ORAL

## 2022-12-10 MED ORDER — BUPIVACAINE-EPINEPHRINE (PF) 0.5% -1:200000 IJ SOLN
INTRAMUSCULAR | Status: DC | PRN
  Administered 2022-12-10: 2.75 mL via PERINEURAL

## 2022-12-10 MED ORDER — SODIUM CHLORIDE 0.9 % IV SOLN
INTRAVENOUS | Status: DC | PRN
  Administered 2022-12-10: .1 ug/kg/min via INTRAVENOUS

## 2022-12-10 MED ORDER — CHLORHEXIDINE GLUCONATE 0.12 % MT SOLN
OROMUCOSAL | Status: AC
  Administered 2022-12-10: 15 mL via OROMUCOSAL
  Filled 2022-12-10: qty 15

## 2022-12-10 MED ORDER — MIDAZOLAM HCL 2 MG/2ML IJ SOLN
INTRAMUSCULAR | Status: DC | PRN
  Administered 2022-12-10: 2 mg via INTRAVENOUS

## 2022-12-10 MED ORDER — SUCCINYLCHOLINE CHLORIDE 200 MG/10ML IV SOSY
PREFILLED_SYRINGE | INTRAVENOUS | Status: DC | PRN
  Administered 2022-12-10: 100 mg via INTRAVENOUS

## 2022-12-10 MED ORDER — FENTANYL CITRATE (PF) 100 MCG/2ML IJ SOLN
INTRAMUSCULAR | Status: DC | PRN
  Administered 2022-12-10: 100 ug via INTRAVENOUS

## 2022-12-10 MED ORDER — ONDANSETRON HCL 4 MG/2ML IJ SOLN
INTRAMUSCULAR | Status: DC | PRN
  Administered 2022-12-10: 4 mg via INTRAVENOUS

## 2022-12-10 MED ORDER — KETAMINE HCL 10 MG/ML IJ SOLN
INTRAMUSCULAR | Status: DC | PRN
  Administered 2022-12-10 (×2): 50 mg via INTRAVENOUS

## 2022-12-10 MED ORDER — PROPOFOL 10 MG/ML IV BOLUS
INTRAVENOUS | Status: DC | PRN
  Administered 2022-12-10: 170 mg via INTRAVENOUS

## 2022-12-10 SURGICAL SUPPLY — 49 items
ADH SKN CLS APL DERMABOND .7 (GAUZE/BANDAGES/DRESSINGS) ×1
AGENT HMST KT MTR STRL THRMB (HEMOSTASIS) ×1
BLADE BOVIE TIP EXT 4 (BLADE) IMPLANT
BUR NEURO DRILL SOFT 3.0X3.8M (BURR) ×2 IMPLANT
COUNTER NEEDLE 20/40 LG (NEEDLE) ×2 IMPLANT
DERMABOND ADVANCED .7 DNX12 (GAUZE/BANDAGES/DRESSINGS) ×2 IMPLANT
DISC MOBI-C CERVICAL 15X17 H5 (Miscellaneous) IMPLANT
DRAPE C ARM PK CFD 31 SPINE (DRAPES) ×2 IMPLANT
DRAPE LAPAROTOMY 77X122 PED (DRAPES) ×2 IMPLANT
DRAPE MICROSCOPE SPINE 48X150 (DRAPES) ×2 IMPLANT
ELECT CAUTERY BLADE TIP 2.5 (TIP) ×1
ELECT REM PT RETURN 9FT ADLT (ELECTROSURGICAL) ×1
ELECTRODE CAUTERY BLDE TIP 2.5 (TIP) ×2 IMPLANT
ELECTRODE REM PT RTRN 9FT ADLT (ELECTROSURGICAL) ×2 IMPLANT
FEE INTRAOP CADWELL SUPPLY NCS (MISCELLANEOUS) IMPLANT
FEE INTRAOP MONITOR IMPULS NCS (MISCELLANEOUS) IMPLANT
GLOVE BIOGEL PI IND STRL 6.5 (GLOVE) ×2 IMPLANT
GLOVE BIOGEL PI IND STRL 8.5 (GLOVE) ×4 IMPLANT
GLOVE SURG SYN 6.5 ES PF (GLOVE) ×4 IMPLANT
GLOVE SURG SYN 6.5 PF PI (GLOVE) ×2 IMPLANT
GLOVE SURG SYN 8.5  E (GLOVE) ×3
GLOVE SURG SYN 8.5 E (GLOVE) ×3 IMPLANT
GLOVE SURG SYN 8.5 PF PI (GLOVE) ×6 IMPLANT
GOWN SRG LRG LVL 4 IMPRV REINF (GOWNS) ×2 IMPLANT
GOWN SRG XL LVL 3 NONREINFORCE (GOWNS) ×2 IMPLANT
GOWN STRL NON-REIN TWL XL LVL3 (GOWNS) ×1
GOWN STRL REIN LRG LVL4 (GOWNS) ×1
INTRAOP CADWELL SUPPLY FEE NCS (MISCELLANEOUS)
INTRAOP DISP SUPPLY FEE NCS (MISCELLANEOUS)
INTRAOP MONITOR FEE IMPULS NCS (MISCELLANEOUS)
INTRAOP MONITOR FEE IMPULSE (MISCELLANEOUS)
KIT TURNOVER KIT A (KITS) ×2 IMPLANT
MANIFOLD NEPTUNE II (INSTRUMENTS) ×2 IMPLANT
NDL SAFETY ECLIP 18X1.5 (MISCELLANEOUS) IMPLANT
NS IRRIG 1000ML POUR BTL (IV SOLUTION) ×2 IMPLANT
PACK LAMINECTOMY NEURO (CUSTOM PROCEDURE TRAY) ×2 IMPLANT
PAD ARMBOARD 7.5X6 YLW CONV (MISCELLANEOUS) ×2 IMPLANT
PIN CASPAR SPINAL 12MM (PIN) IMPLANT
SPONGE KITTNER 5P (MISCELLANEOUS) ×2 IMPLANT
STAPLER SKIN PROX 35W (STAPLE) IMPLANT
SURGIFLO W/THROMBIN 8M KIT (HEMOSTASIS) ×2 IMPLANT
SUT DVC VLOC 3-0 CL 6 P-12 (SUTURE) IMPLANT
SUT VIC AB 3-0 SH 8-18 (SUTURE) ×2 IMPLANT
SYR 30ML LL (SYRINGE) ×2 IMPLANT
TAPE CLOTH 3X10 WHT NS LF (GAUZE/BANDAGES/DRESSINGS) ×2 IMPLANT
TOWEL OR 17X26 4PK STRL BLUE (TOWEL DISPOSABLE) ×6 IMPLANT
TRAP FLUID SMOKE EVACUATOR (MISCELLANEOUS) ×2 IMPLANT
TRAY FOLEY MTR SLVR 16FR STAT (SET/KITS/TRAYS/PACK) IMPLANT
TUBING CONNECTING 10 (TUBING) ×2 IMPLANT

## 2022-12-10 NOTE — Progress Notes (Signed)
Up ambulating to bathroom and voiding. Minimal pain. Tolerating fluids and food. Voices readiness for discharge.

## 2022-12-10 NOTE — Discharge Instructions (Addendum)
Your surgeon has performed an operation on your cervical spine (neck) to relieve pressure on the spinal cord and/or nerves. This involved making an incision in the front of your neck and removing one or more of the discs that support your spine. Next, a small piece of bone, a titanium plate, and screws were used to fuse two or more of the vertebrae (bones) together.  The following are instructions to help in your recovery once you have been discharged from the hospital. Even if you feel well, it is important that you follow these activity guidelines. If you do not let your neck heal properly from the surgery, you can increase the chance of return of your symptoms and other complications.  Activity    No bending, lifting, or twisting ("BLT"). Avoid lifting objects heavier than 10 pounds (gallon milk jug).  Where possible, avoid household activities that involve lifting, bending, reaching, pushing, or pulling such as laundry, vacuuming, grocery shopping, and childcare. Try to arrange for help from friends and family for these activities while your back heals.  Increase physical activity slowly as tolerated.  Taking short walks is encouraged, but avoid strenuous exercise. Do not jog, run, bicycle, lift weights, or participate in any other exercises unless specifically allowed by your doctor.  Talk to your doctor before resuming sexual activity.  You should not drive until cleared by your doctor.  Until released by your doctor, you should not return to work or school.  You should rest at home and let your body heal.   You may shower three days after your surgery.  After showering, lightly dab your incision dry. Do not take a tub bath or go swimming until approved by your doctor at your follow-up appointment.  If your doctor ordered a cervical collar (neck brace) for you, you should wear it whenever you are out of bed. You may remove it when lying down or sleeping, but you should wear it at all other  times. Not all neck surgeries require a cervical collar.  If you smoke, we strongly recommend that you quit.  Smoking has been proven to interfere with normal bone healing and will dramatically reduce the success rate of your surgery. Please contact [facility] (800-QUIT-NOW) and use the resources at www.[facility].com for assistance in stopping smoking.  Surgical Incision   If you have a dressing on your incision, you may remove it two days after your surgery. Keep your incision area clean and dry.  If you have staples or stitches on your incision, you should have a follow up scheduled for removal. If you do not have staples or stitches, you will have steri-strips (small pieces of surgical tape) or Dermabond glue. The steri-strips/glue should begin to peel away within about a week (it is fine if the steri-strips fall off before then). If the strips are still in place one week after your surgery, you may gently remove them.  Diet           You may return to your usual diet. However, you may experience discomfort when swallowing in the first month after your surgery. This is normal. You may find that softer foods are more comfortable for you to swallow. Be sure to stay hydrated.  When to Contact Us  You may experience pain in your neck and/or pain between your shoulder blades. This is normal and should improve in the next few weeks with the help of pain medication, muscle relaxers, and rest. Some patients report that a warm compress   on the back of the neck or between the shoulder blades helps.  However, should you experience any of the following, contact us immediately: New numbness or weakness Pain that is progressively getting worse, and is not relieved by your pain medication, muscle relaxers, rest, and warm compresses Bleeding, redness, swelling, pain, or drainage from surgical incision Chills or flu-like symptoms Fever greater than 101.0 F (38.3 C) Inability to eat, drink fluids, or take  medications Problems with bowel or bladder functions Difficulty breathing or shortness of breath Warmth, tenderness, or swelling in your calf Contact Information During office hours (Monday-Friday 9 am to 5 pm), please call your physician at [phone] and ask for Kendelyn Jean After hours and weekends, please call [phone] and speak with the neurosurgeon on call For a life-threatening emergency, call 911     AMBULATORY SURGERY  DISCHARGE INSTRUCTIONS   The drugs that you were given will stay in your system until tomorrow so for the next 24 hours you should not:  Drive an automobile Make any legal decisions Drink any alcoholic beverage   You may resume regular meals tomorrow.  Today it is better to start with liquids and gradually work up to solid foods.  You may eat anything you prefer, but it is better to start with liquids, then soup and crackers, and gradually work up to solid foods.   Please notify your doctor immediately if you have any unusual bleeding, trouble breathing, redness and pain at the surgery site, drainage, fever, or pain not relieved by medication.    Additional Instructions:        Please contact your physician with any problems or Same Day Surgery at [phone], Monday through Friday 6 am to 4 pm, or [facility] at [facility] Main number at [phone]. 

## 2022-12-10 NOTE — Discharge Summary (Signed)
Physician Discharge Summary  Patient ID: Rebekah Miller MRN: [id number] DOB/AGE: 1971-02-15 52 y.o.  Admit date: 12/10/2022 Discharge date: 12/10/2022  Admission Diagnoses:  Discharge Diagnoses:  Active Problems:   * No active hospital problems. *   Discharged Condition: {condition:18240}  Hospital Course: ***  Consults: {consultation:18241}  Significant Diagnostic Studies: {diagnostics:18242}  Treatments: {Tx:18249}  Discharge Exam: Blood pressure (!) 141/84, pulse 81, temperature 99 F (37.2 C), resp. rate 16, height 5\' 7"  (1.702 m), weight 86.4 kg, last menstrual period 11/07/2022, SpO2 99 %. {physical SA:931536  Disposition: Discharge disposition: 01-Home or Self Care        Allergies as of 12/10/2022   No Known Allergies      Medication List     STOP taking these medications    ibuprofen 200 MG tablet Commonly known as: ADVIL       TAKE these medications    bismuth subsalicylate 99991111 MG chewable tablet Commonly known as: PEPTO BISMOL Chew 524 mg by mouth as needed.   busPIRone 5 MG tablet Commonly known as: BUSPAR Take 5 mg by mouth as needed.   calcium carbonate 500 MG chewable tablet Commonly known as: TUMS - dosed in mg elemental calcium Chew 1 tablet by mouth as needed for indigestion or heartburn.   methocarbamol 500 MG tablet Commonly known as: ROBAXIN Take 1 tablet (500 mg total) by mouth every 6 (six) hours as needed for muscle spasms.   naproxen 500 MG tablet Commonly known as: Naprosyn Take 1 tablet (500 mg total) by mouth 2 (two) times daily with a meal.   oxyCODONE-acetaminophen 5-325 MG tablet Commonly known as: Percocet Take 1 tablet by mouth every 4 (four) hours as needed for up to 5 days.        Follow-up Information     Geronimo Boot, PA-C Follow up on 01/21/2023.   Specialty: Neurosurgery Contact information: [address] [place] [id number] [phone]                  Signed: Loleta Dicker 12/10/2022, 9:09 AM

## 2022-12-10 NOTE — Anesthesia Postprocedure Evaluation (Signed)
Anesthesia Post Note  Patient: Rebekah Miller  Procedure(s) Performed: C6-7 ARTHROPLASTY (LDR) (Neck)  Patient location during evaluation: PACU Anesthesia Type: General Level of consciousness: awake and alert Pain management: pain level controlled Vital Signs Assessment: post-procedure vital signs reviewed and stable Respiratory status: spontaneous breathing, nonlabored ventilation, respiratory function stable and patient connected to nasal cannula oxygen Cardiovascular status: blood pressure returned to baseline and stable Postop Assessment: no apparent nausea or vomiting Anesthetic complications: no   No notable events documented.   Last Vitals:  Vitals:   12/10/22 0945 12/10/22 1026  BP: 130/75 (!) 142/65  Pulse: (!) 103 87  Resp: 11 16  Temp:  36.6 C  SpO2: 96% 94%    Last Pain:  Vitals:   12/10/22 1026  TempSrc: Temporal  PainSc: 0-No pain                 Arita Miss

## 2022-12-10 NOTE — Anesthesia Preprocedure Evaluation (Signed)
Anesthesia Evaluation  Patient identified by MRN, date of birth, ID band Patient awake    Reviewed: Allergy & Precautions, NPO status , Patient's Chart, lab work & pertinent test results  History of Anesthesia Complications Negative for: history of anesthetic complications  Airway Mallampati: II  TM Distance: >3 FB Neck ROM: Limited    Dental no notable dental hx. 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 No cardiac issues or symptoms since then.   Neuro/Psych  PSYCHIATRIC DISORDERS Anxiety Depression     Neuromuscular disease    GI/Hepatic ,GERD  Controlled,,(+)     (-) substance abuse    Endo/Other  neg diabetes    Renal/GU negative Renal ROS     Musculoskeletal   Abdominal   Peds  Hematology   Anesthesia Other Findings Past Medical History: No date: Anxiety No date: Chronic fatigue No date: Complication of anesthesia     Comment:  delayed emergence after cholecystectomy 09/2022: COVID-19 No date: Depression No date: Dyspnea No date: GERD (gastroesophageal reflux disease) No date: Heart murmur No date: Hirsutism No date: Hx of migraines No date: Hypertriglyceridemia No date: Mixed incontinence No date: Seizure ([place])     Comment:  as a child-febrile No date: Vitamin D deficiency No date: VSD (ventricular septal defect)  Reproductive/Obstetrics                              Anesthesia Physical Anesthesia Plan  ASA: 2  Anesthesia Plan: General    Post-op Pain Management: Ofirmev IV (intra-op)*   Induction: Intravenous  PONV Risk Score and Plan: 4 or greater and Ondansetron, Dexamethasone, Midazolam, TIVA and Propofol infusion  Airway Management Planned: Oral ETT and Video Laryngoscope Planned  Additional Equipment: None  Intra-op Plan:   Post-operative Plan: Extubation in OR  Informed Consent: I have reviewed the patients History and Physical, chart, labs and discussed the procedure including the risks, benefits and alternatives for the proposed anesthesia with the patient or authorized representative who has indicated his/her understanding and acceptance.     Dental advisory given  Plan Discussed with: CRNA and Surgeon  Anesthesia Plan Comments: (Discussed risks of anesthesia with patient, including PONV, sore throat, lip/dental/eye damage. Rare risks discussed as well, such as cardiorespiratory and neurological sequelae, and allergic reactions. Discussed the role of CRNA in patient's perioperative care. Patient understands.)         Anesthesia Quick Evaluation

## 2022-12-10 NOTE — Op Note (Addendum)
Indications: Rebekah Miller is a 52 yo female who presented with M54.12 cervical radiculopathy . She failed conservative management prompting surgical intervention.  Findings: large disc herniation on R at C6/7  Preoperative Diagnosis: M54.12 cervical radiculopathy  Postoperative Diagnosis: same   EBL: 20 ml IVF: see AR ml Drains: none Disposition: Extubated and Stable to PACU Complications: none  No foley catheter was placed.   Preoperative Note:   Risks of surgery discussed include: infection, bleeding, stroke, coma, death, paralysis, CSF leak, nerve/spinal cord injury, numbness, tingling, weakness, complex regional pain syndrome, recurrent stenosis and/or disc herniation, vascular injury, development of instability, neck/back pain, need for further surgery, persistent symptoms, development of deformity, and the risks of anesthesia. The patient understood these risks and agreed to proceed.  Operative Note:  Procedure:  1) Cervical Disc Arthroplasty at C6/7 using a LDR Mobi-C device   Procedure: After obtaining informed consent, the patient taken to the operating room, placed in supine position, general anesthesia induced.  The patient had a small shoulder roll placed behind the neck.  The patient received preop antibiotics and IV Decadron.  The patient had a neck incision outlined, was prepped and draped in usual sterile fashion. The incision was injected with local anesthetic.   An incision was opened, dissection taken down medial to the carotid artery and jugular vein, lateral to the trachea and esophagus.  The prevertebral fascia identified and a localizing x-ray demonstrated the correct level.  The longus colli were dissected laterally, and self-retaining retractors placed to open the operative field. The microscope was then brought into the field.  With this complete, distractor pins were placed in the vertebral bodies of C6 and C7. The distractor was placed, and the anulus at C6/7  was opened using a bovie.  Curettes and pituitary rongeurs used to remove the majority of disk, which was eccentric to the right.   The nerve hook was used to elevate the posterior longitudinal ligament, which was then removed with Kerrison rongeurs. The 89mm kerrison was used to complete the foraminotomies. The microblunt nerve hook could be passed out the foramen bilaterally.   Meticulous hemostasis was obtained.  A trial spacer was used to size the disc space. Using flouroscopic guidance, a 17 mm width x 15 mm depth x 5 mm height Mobi-C was then inserted in the prepared disc space.  The caspar distractor was removed, and bone wax used for hemostasis. Final AP and lateral radiographs were taken.   With the disc arthroplasty in good position, the wound was irrigated copiously and meticulous hemostasis obtained.  Wound was closed in 2 layers using interrupted inverted 3-0 Vicryl sutures.  The wound was dressed with dermabond, the head of bed at 30 degrees, taken to recovery room in stable condition.  No new postop neurological deficits were identified.  Sponge and pattie counts were correct at the end of the procedure.     I performed the entire procedure with the assistance of Cooper Render PA as an Pensions consultant. An assistant was required for this procedure due to the complexity.  The assistant provided assistance in tissue manipulation and suction, and was required for the successful and safe performance of the procedure. I performed the critical portions of the procedure.   Meade Maw MD

## 2022-12-10 NOTE — Anesthesia Procedure Notes (Signed)
Procedure Name: Intubation Date/Time: 12/10/2022 7:31 AM  Performed by: Fredderick Phenix, CRNAPre-anesthesia Checklist: Patient identified, Emergency Drugs available, Suction available and Patient being monitored Patient Re-evaluated:Patient Re-evaluated prior to induction Oxygen Delivery Method: Circle system utilized Preoxygenation: Pre-oxygenation with 100% oxygen Induction Type: IV induction Ventilation: Mask ventilation without difficulty Laryngoscope Size: McGraph and 4 Grade View: Grade I Tube type: Oral Tube size: 7.0 mm Number of attempts: 1 Airway Equipment and Method: Stylet and Oral airway Placement Confirmation: ETT inserted through vocal cords under direct vision, positive ETCO2 and breath sounds checked- equal and bilateral Secured at: 21 cm Tube secured with: Tape Dental Injury: Teeth and Oropharynx as per pre-operative assessment

## 2022-12-10 NOTE — Interval H&P Note (Signed)
History and Physical Interval Note:  12/10/2022 6:45 AM  Rebekah Miller  has presented today for surgery, with the diagnosis of M54.12 cervical radiculopathy.  The various methods of treatment have been discussed with the patient and family. After consideration of risks, benefits and other options for treatment, the patient has consented to  Procedure(s): C6-7 ARTHROPLASTY (LDR) (N/A) as a surgical intervention.  The patient's history has been reviewed, patient examined, no change in status, stable for surgery.  I have reviewed the patient's chart and labs.  Questions were answered to the patient's satisfaction.    Heart sounds normal no MRG. Chest Clear to Auscultation Bilaterally.  Tanga Gloor

## 2022-12-10 NOTE — Transfer of Care (Signed)
Immediate Anesthesia Transfer of Care Note  Patient: Rebekah Miller  Procedure(s) Performed: C6-7 ARTHROPLASTY (LDR) (Neck)  Patient Location: PACU  Anesthesia Type:General  Level of Consciousness: awake and alert   Airway & Oxygen Therapy: Patient Spontanous Breathing and Patient connected to face mask oxygen  Post-op Assessment: Report given to RN and Post -op Vital signs reviewed and stable  Post vital signs: Reviewed and stable  Last Vitals:  Vitals Value Taken Time  BP    Temp    Pulse 106 12/10/22 0921  Resp 23 12/10/22 0921  SpO2 98 % 12/10/22 0921  Vitals shown include unvalidated device data.  Last Pain:  Vitals:   12/10/22 0618  PainSc: 6          Complications: No notable events documented.

## 2022-12-11 ENCOUNTER — Encounter: Payer: Self-pay | Admitting: Neurosurgery

## 2022-12-14 ENCOUNTER — Encounter: Payer: Self-pay | Admitting: Neurosurgery

## 2022-12-14 NOTE — Telephone Encounter (Signed)
She had C6-C7 arthroplasty on 12/10/22.   Okay to return to work. Do not take pain medication during working hours.   Note done and message sent.

## 2022-12-20 NOTE — Progress Notes (Signed)
   REFERRING PHYSICIAN:  Elza Rafter, [id number] [place],  [place] [address]  DOS: 12/10/22 C6-C7 arthroplasty  HISTORY OF PRESENT ILLNESS: Rebekah Miller is 2 weeks status post C6-C7 arthroplasty. Given oxycodone and robaxin on discharge from the hospital.   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 She was okay to start this on Monday 12/17/22.   She is doing great. She has minimal neck pain. No arm pain. She still has some numbness/tingling in right arm.   She is not taking her oxycodone or robaxin.    PHYSICAL EXAMINATION:  NEUROLOGICAL:  General: In no acute distress.   Awake, alert, oriented to person, place, and time.  Pupils equal round and reactive to light.  Facial tone is symmetric.    Strength: Side Biceps Triceps Deltoid Interossei Grip Wrist Ext. Wrist Flex.  R 5 5 5 5 5 5 5   L 5 5 5 5 5 5 5    Incision c/d/i  Imaging:  Nothing new to review.   Assessment / Plan: DYMONIQUE GILLEN is doing well s/p above surgery. Treatment options reviewed with patient and following plan made:   - We discussed activity escalation and I have advised the patient to lift up to 10 pounds until 6 weeks after surgery (until follow up with Dr. Izora Ribas).   - Reviewed wound care.  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 She does not do lifting.  - Follow up as scheduled in 4 weeks and prn.   Advised to contact the office if any questions or concerns arise.   Geronimo Boot PA-C Dept of Neurosurgery

## 2022-12-21 ENCOUNTER — Ambulatory Visit (INDEPENDENT_AMBULATORY_CARE_PROVIDER_SITE_OTHER): Payer: BC Managed Care – PPO | Admitting: Orthopedic Surgery

## 2022-12-21 ENCOUNTER — Encounter: Payer: Self-pay | Admitting: Orthopedic Surgery

## 2022-12-21 VITALS — Ht 67.0 in | Wt 190.0 lb

## 2022-12-21 DIAGNOSIS — M5020 Other cervical disc displacement, unspecified cervical region: Secondary | ICD-10-CM

## 2022-12-21 DIAGNOSIS — Z09 Encounter for follow-up examination after completed treatment for conditions other than malignant neoplasm: Secondary | ICD-10-CM

## 2022-12-21 DIAGNOSIS — M5412 Radiculopathy, cervical region: Secondary | ICD-10-CM

## 2022-12-21 DIAGNOSIS — Z9889 Other specified postprocedural states: Secondary | ICD-10-CM

## 2023-01-01 ENCOUNTER — Other Ambulatory Visit: Payer: Self-pay | Admitting: Neurosurgery

## 2023-01-21 ENCOUNTER — Other Ambulatory Visit: Payer: Self-pay

## 2023-01-21 DIAGNOSIS — M5412 Radiculopathy, cervical region: Secondary | ICD-10-CM

## 2023-01-22 ENCOUNTER — Encounter: Payer: BC Managed Care – PPO | Admitting: Neurosurgery

## 2023-01-28 ENCOUNTER — Other Ambulatory Visit: Payer: Self-pay

## 2023-01-28 DIAGNOSIS — M5412 Radiculopathy, cervical region: Secondary | ICD-10-CM

## 2023-01-29 ENCOUNTER — Encounter: Payer: Self-pay | Admitting: Neurosurgery

## 2023-01-29 ENCOUNTER — Ambulatory Visit
Admission: RE | Admit: 2023-01-29 | Discharge: 2023-01-29 | Disposition: A | Discharge status: Home / Self Care | Payer: BC Managed Care – PPO | Source: Ambulatory Visit | Attending: Neurosurgery | Admitting: Neurosurgery

## 2023-01-29 ENCOUNTER — Ambulatory Visit (INDEPENDENT_AMBULATORY_CARE_PROVIDER_SITE_OTHER): Payer: BC Managed Care – PPO | Admitting: Neurosurgery

## 2023-01-29 VITALS — BP 116/71 | Ht 67.0 in | Wt 190.0 lb

## 2023-01-29 DIAGNOSIS — Z09 Encounter for follow-up examination after completed treatment for conditions other than malignant neoplasm: Secondary | ICD-10-CM

## 2023-01-29 DIAGNOSIS — M5412 Radiculopathy, cervical region: Secondary | ICD-10-CM

## 2023-01-29 NOTE — Progress Notes (Signed)
   REFERRING PHYSICIAN:  No referring provider defined for this encounter.  DOS: 12/10/22 C6-C7 arthroplasty  HISTORY OF PRESENT ILLNESS: Rebekah Miller is status post C6-C7 arthroplasty.   She is doing extremely well.  She has no pain.  PHYSICAL EXAMINATION:  NEUROLOGICAL:  General: In no acute distress.   Awake, alert, oriented to person, place, and time.  Pupils equal round and reactive to light.  Facial tone is symmetric.    Strength: Side Biceps Triceps Deltoid Interossei Grip Wrist Ext. Wrist Flex.  R 5 5 5 5 5 5 5   L 5 5 5 5 5 5 5    Incision c/d/i  Imaging:  No complications noted  Assessment / Plan: Rebekah Miller is doing well s/p above surgery.   We reviewed her activity limitations.  Will see her back in approximately 6 weeks.  She is doing extremely well.  Venetia Night Dept of Neurosurgery

## 2023-02-26 ENCOUNTER — Other Ambulatory Visit: Payer: Self-pay | Admitting: Orthopedic Surgery

## 2023-02-26 DIAGNOSIS — M5020 Other cervical disc displacement, unspecified cervical region: Secondary | ICD-10-CM

## 2023-02-26 DIAGNOSIS — Z9889 Other specified postprocedural states: Secondary | ICD-10-CM

## 2023-02-26 NOTE — Progress Notes (Deleted)
   REFERRING PHYSICIAN:  Drema Halon, [facility] [address] [place],  [place] [address]  DOS: 12/10/22 C6-C7 arthroplasty  HISTORY OF PRESENT ILLNESS: CHRISTYANN SHAKE was doing well at her last visit with Dr. Myer Haff on 01/29/23.          PHYSICAL EXAMINATION:  NEUROLOGICAL:  General: In no acute distress.   Awake, alert, oriented to person, place, and time.  Pupils equal round and reactive to light.  Facial tone is symmetric.    Strength: Side Biceps Triceps Deltoid Interossei Grip Wrist Ext. Wrist Flex.  R 5 5 5 5 5 5 5   L 5 5 5 5 5 5 5    Incision well healed.   Imaging:  Cervical xrays dated ***:  No complications noted. ***  Radiology report for xrays not available.    Assessment / Plan: CHEREL ROEHRIG is doing well s/p above surgery. Treatment options reviewed with patient and following plan made:   - We discussed activity escalation and I have advised the patient to lift up to 10 pounds until 6 weeks after surgery (until follow up with Dr. Myer Haff).   - Reviewed wound care.  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 She does not do lifting.  - Follow up as scheduled in 4 weeks and prn.   Advised to contact the office if any questions or concerns arise.   Drake Leach PA-C Dept of Neurosurgery

## 2023-02-28 ENCOUNTER — Other Ambulatory Visit: Payer: Self-pay

## 2023-02-28 ENCOUNTER — Encounter: Payer: BC Managed Care – PPO | Admitting: Orthopedic Surgery

## 2023-02-28 DIAGNOSIS — Z9889 Other specified postprocedural states: Secondary | ICD-10-CM

## 2023-02-28 DIAGNOSIS — M5412 Radiculopathy, cervical region: Secondary | ICD-10-CM

## 2023-02-28 NOTE — Progress Notes (Signed)
error 

## 2023-07-07 ENCOUNTER — Emergency Department
Admission: EM | Admit: 2023-07-07 | Discharge: 2023-07-07 | Disposition: A | Discharge status: Home / Self Care | Payer: BC Managed Care – PPO | Attending: Emergency Medicine | Admitting: Emergency Medicine

## 2023-07-07 ENCOUNTER — Other Ambulatory Visit: Payer: Self-pay

## 2023-07-07 DIAGNOSIS — N939 Abnormal uterine and vaginal bleeding, unspecified: Secondary | ICD-10-CM | POA: Diagnosis present

## 2023-07-07 LAB — BASIC METABOLIC PANEL
Anion gap: 12 (ref 5–15)
BUN: 9 mg/dL (ref 6–20)
CO2: 22 mmol/L (ref 22–32)
Calcium: 8.6 mg/dL — ABNORMAL LOW (ref 8.9–10.3)
Chloride: 103 mmol/L (ref 98–111)
Creatinine, Ser: 0.69 mg/dL (ref 0.44–1.00)
GFR, Estimated: >60 mL/min (ref >60)
Glucose, Bld: 89 mg/dL (ref 70–99)
Potassium: 4 mmol/L (ref 3.5–5.1)
Sodium: 137 mmol/L (ref 135–145)

## 2023-07-07 LAB — URINALYSIS, ROUTINE W REFLEX MICROSCOPIC
Bacteria, UA: NONE SEEN
RBC / HPF: >50 RBC/hpf (ref 0–5)
Squamous Epithelial / HPF: 0 /[HPF] (ref 0–5)

## 2023-07-07 LAB — CBC WITH DIFFERENTIAL/PLATELET
Abs Immature Granulocytes: 0.04 10*3/uL (ref 0.00–0.07)
Basophils Absolute: 0.1 10*3/uL (ref 0.0–0.1)
Basophils Relative: 1 %
Eosinophils Absolute: 0.4 10*3/uL (ref 0.0–0.5)
Eosinophils Relative: 4 %
HCT: 40.5 % (ref 36.0–46.0)
Hemoglobin: 13 g/dL (ref 12.0–15.0)
Immature Granulocytes: 0 %
Lymphocytes Relative: 24 %
Lymphs Abs: 2.2 10*3/uL (ref 0.7–4.0)
MCH: 28 pg (ref 26.0–34.0)
MCHC: 32.1 g/dL (ref 30.0–36.0)
MCV: 87.1 fL (ref 80.0–100.0)
Monocytes Absolute: 0.7 10*3/uL (ref 0.1–1.0)
Monocytes Relative: 7 %
Neutro Abs: 5.9 10*3/uL (ref 1.7–7.7)
Neutrophils Relative %: 64 %
Platelets: 349 10*3/uL (ref 150–400)
RBC: 4.65 MIL/uL (ref 3.87–5.11)
RDW: 13.1 % (ref 11.5–15.5)
WBC: 9.3 10*3/uL (ref 4.0–10.5)
nRBC: 0 % (ref 0.0–0.2)

## 2023-07-07 LAB — ABO/RH: ABO/RH(D): O POS

## 2023-07-07 LAB — POC URINE PREG, ED: Preg Test, Ur: NEGATIVE

## 2023-07-07 LAB — HCG, QUANTITATIVE, PREGNANCY: hCG, Beta Chain, Quant, S: <1 m[IU]/mL (ref <5)

## 2023-07-07 MED ORDER — OXYCODONE-ACETAMINOPHEN 5-325 MG PO TABS
1.0000 | ORAL_TABLET | Freq: Once | ORAL | Status: AC
  Administered 2023-07-07: 1 via ORAL
  Filled 2023-07-07: qty 1

## 2023-07-07 NOTE — ED Triage Notes (Signed)
Pt sts that she is having all the s/s of a miscarriage. Pt believes that she is 7 wks preg. G3P2A1

## 2023-07-07 NOTE — ED Provider Notes (Signed)
[facility] Provider Note    Event Date/Time   First MD Initiated Contact with Patient 07/07/23 1150     (approximate)   History   Threatened Miscarriage   HPI  Rebekah Miller is a 52 y.o. female G3 P2 A1 presents emergency department stating she had a positive home pregnancy test and thinks she is about [redacted] weeks pregnant.  Started having heavy cramping and bleeding yesterday.  No fever or chills.  No vomiting.      Physical Exam   Triage Vital Signs: ED Triage Vitals  Encounter Vitals Group     BP 07/07/23 1148 (!) 169/80     Systolic BP Percentile --      Diastolic BP Percentile --      Pulse Rate 07/07/23 1148 83     Resp 07/07/23 1148 18     Temp 07/07/23 1148 98.7 F (37.1 C)     Temp Source 07/07/23 1148 Oral     SpO2 07/07/23 1148 98 %     Weight 07/07/23 1149 180 lb (81.6 kg)     Height 07/07/23 1149 5\' 7"  (1.702 m)     Head Circumference --      Peak Flow --      Pain Score 07/07/23 1148 3     Pain Loc --      Pain Education --      Exclude from Growth Chart --     Most recent vital signs: Vitals:   07/07/23 1148  BP: (!) 169/80  Pulse: 83  Resp: 18  Temp: 98.7 F (37.1 C)  SpO2: 98%     General: Awake, no distress.   CV:  Good peripheral perfusion. regular rate and  rhythm Resp:  Normal effort.  Abd:  No distention.  Nontender Other:      ED Results / Procedures / Treatments   Labs (all labs ordered are listed, but only abnormal results are displayed) Labs Reviewed  URINALYSIS, ROUTINE W REFLEX MICROSCOPIC - Abnormal; Notable for the following components:      Result Value   Color, Urine RED (*)    APPearance TURBID (*)    Glucose, UA   (*)    Value: TEST NOT REPORTED DUE TO COLOR INTERFERENCE OF URINE PIGMENT   Hgb urine dipstick   (*)    Value: TEST NOT REPORTED DUE TO COLOR INTERFERENCE OF URINE PIGMENT   Bilirubin Urine   (*)    Value: TEST NOT REPORTED DUE TO COLOR INTERFERENCE OF URINE PIGMENT    Ketones, ur   (*)    Value: TEST NOT REPORTED DUE TO COLOR INTERFERENCE OF URINE PIGMENT   Protein, ur   (*)    Value: TEST NOT REPORTED DUE TO COLOR INTERFERENCE OF URINE PIGMENT   Nitrite   (*)    Value: TEST NOT REPORTED DUE TO COLOR INTERFERENCE OF URINE PIGMENT   Leukocytes,Ua   (*)    Value: TEST NOT REPORTED DUE TO COLOR INTERFERENCE OF URINE PIGMENT   All other components within normal limits  BASIC METABOLIC PANEL - Abnormal; Notable for the following components:   Calcium 8.6 (*)    All other components within normal limits  CBC WITH DIFFERENTIAL/PLATELET  HCG, QUANTITATIVE, PREGNANCY  POC URINE PREG, ED  ABO/RH     EKG     RADIOLOGY     PROCEDURES:   Procedures   MEDICATIONS ORDERED IN ED: Medications  oxyCODONE-acetaminophen (PERCOCET/ROXICET) 5-325 MG per tablet 1  tablet (1 tablet Oral Given 07/07/23 1223)     IMPRESSION / MDM / ASSESSMENT AND PLAN / ED COURSE  I reviewed the triage vital signs and the nursing notes.                              Differential diagnosis includes, but is not limited to, faults pregnancy test, threatened miscarriage, subchorionic hemorrhage, complete miscarriage, uterine fibroids, dysfunctional uterine bleeding, mass  Patient's presentation is most consistent with acute illness / injury with system symptoms.   POC pregnancy, urinalysis ordered, Labs ordered  If pregnancy test are actually positive will order ultrasound.   Labs are reassuring, POC pregnancy and beta-hCG are both negative  I did explain these findings to patient.  Explained to her that she is most likely does have a very heavy period.  Did explain that she should follow-up with her GYN doctor.  Most likely she is perimenopausal.  Return emergency department if worsening.  She is in agreement treatment plan.  Discharged stable condition   FINAL CLINICAL IMPRESSION(S) / ED DIAGNOSES   Final diagnoses:  Vaginal bleeding     Rx / DC Orders   ED  Discharge Orders     None        Note:  This document was prepared using Dragon voice recognition software and may include unintentional dictation errors.    Faythe Ghee, PA-C 07/07/23 1352    Loleta Rose, MD 07/07/23 2112

## 2023-07-19 NOTE — Plan of Care (Signed)
 [facility] Tonsillectomy/Adenoidectomy, Postoperative PEDS care plan entered in error.

## 2024-07-29 ENCOUNTER — Other Ambulatory Visit: Payer: Self-pay | Admitting: Medical Genetics

## 2024-08-01 ENCOUNTER — Other Ambulatory Visit: Payer: Self-pay

## 2024-08-08 ENCOUNTER — Other Ambulatory Visit: Payer: Self-pay

## 2024-08-15 ENCOUNTER — Other Ambulatory Visit: Payer: Self-pay | Attending: Medical Genetics

## 2024-10-20 ENCOUNTER — Other Ambulatory Visit: Payer: Self-pay | Admitting: Medical Genetics

## 2024-10-20 DIAGNOSIS — Z006 Encounter for examination for normal comparison and control in clinical research program: Secondary | ICD-10-CM
